# Patient Record
Sex: Male | Born: 1965
Health system: Southern US, Community
[De-identification: ages and names within clinical notes are randomized; demographics above are authoritative.]

---

## 2011-02-22 DIAGNOSIS — I420 Dilated cardiomyopathy: Secondary | ICD-10-CM | POA: Insufficient documentation

## 2011-02-22 DIAGNOSIS — I2510 Atherosclerotic heart disease of native coronary artery without angina pectoris: Secondary | ICD-10-CM | POA: Insufficient documentation

## 2011-02-22 DIAGNOSIS — E782 Mixed hyperlipidemia: Secondary | ICD-10-CM | POA: Insufficient documentation

## 2011-10-19 DIAGNOSIS — I890 Lymphedema, not elsewhere classified: Secondary | ICD-10-CM | POA: Insufficient documentation

## 2011-11-17 DIAGNOSIS — D6851 Activated protein C resistance: Secondary | ICD-10-CM | POA: Insufficient documentation

## 2011-11-21 DIAGNOSIS — G4733 Obstructive sleep apnea (adult) (pediatric): Secondary | ICD-10-CM | POA: Insufficient documentation

## 2011-11-21 DIAGNOSIS — I10 Essential (primary) hypertension: Secondary | ICD-10-CM | POA: Insufficient documentation

## 2011-11-21 DIAGNOSIS — I2699 Other pulmonary embolism without acute cor pulmonale: Secondary | ICD-10-CM | POA: Insufficient documentation

## 2012-09-16 DIAGNOSIS — I2510 Atherosclerotic heart disease of native coronary artery without angina pectoris: Secondary | ICD-10-CM | POA: Insufficient documentation

## 2012-09-21 DIAGNOSIS — M48061 Spinal stenosis, lumbar region without neurogenic claudication: Secondary | ICD-10-CM | POA: Insufficient documentation

## 2013-07-10 DIAGNOSIS — I82519 Chronic embolism and thrombosis of unspecified femoral vein: Secondary | ICD-10-CM | POA: Insufficient documentation

## 2013-07-10 DIAGNOSIS — I872 Venous insufficiency (chronic) (peripheral): Secondary | ICD-10-CM | POA: Insufficient documentation

## 2013-07-22 DIAGNOSIS — M79604 Pain in right leg: Secondary | ICD-10-CM | POA: Insufficient documentation

## 2013-09-01 DIAGNOSIS — F411 Generalized anxiety disorder: Secondary | ICD-10-CM | POA: Insufficient documentation

## 2013-09-03 DIAGNOSIS — F329 Major depressive disorder, single episode, unspecified: Secondary | ICD-10-CM | POA: Insufficient documentation

## 2013-09-12 DIAGNOSIS — B351 Tinea unguium: Secondary | ICD-10-CM | POA: Insufficient documentation

## 2013-09-12 DIAGNOSIS — L570 Actinic keratosis: Secondary | ICD-10-CM | POA: Insufficient documentation

## 2014-12-23 DIAGNOSIS — I872 Venous insufficiency (chronic) (peripheral): Secondary | ICD-10-CM | POA: Insufficient documentation

## 2014-12-23 DIAGNOSIS — I739 Peripheral vascular disease, unspecified: Secondary | ICD-10-CM | POA: Insufficient documentation

## 2015-10-19 DIAGNOSIS — Z9889 Other specified postprocedural states: Secondary | ICD-10-CM | POA: Insufficient documentation

## 2015-10-23 DIAGNOSIS — E8770 Fluid overload, unspecified: Secondary | ICD-10-CM | POA: Insufficient documentation

## 2015-10-23 DIAGNOSIS — I5023 Acute on chronic systolic (congestive) heart failure: Secondary | ICD-10-CM | POA: Insufficient documentation

## 2015-11-03 DIAGNOSIS — I890 Lymphedema, not elsewhere classified: Secondary | ICD-10-CM | POA: Insufficient documentation

## 2015-11-03 DIAGNOSIS — T8189XA Other complications of procedures, not elsewhere classified, initial encounter: Secondary | ICD-10-CM | POA: Insufficient documentation

## 2015-11-09 DIAGNOSIS — T8131XA Disruption of external operation (surgical) wound, not elsewhere classified, initial encounter: Secondary | ICD-10-CM | POA: Insufficient documentation

## 2015-11-19 DIAGNOSIS — F332 Major depressive disorder, recurrent severe without psychotic features: Secondary | ICD-10-CM | POA: Insufficient documentation

## 2015-12-07 DIAGNOSIS — T8131XA Disruption of external operation (surgical) wound, not elsewhere classified, initial encounter: Secondary | ICD-10-CM | POA: Diagnosis not present

## 2015-12-07 DIAGNOSIS — I2510 Atherosclerotic heart disease of native coronary artery without angina pectoris: Secondary | ICD-10-CM | POA: Diagnosis present

## 2015-12-07 DIAGNOSIS — E039 Hypothyroidism, unspecified: Secondary | ICD-10-CM | POA: Diagnosis present

## 2015-12-07 DIAGNOSIS — E13620 Other specified diabetes mellitus with diabetic dermatitis: Secondary | ICD-10-CM | POA: Diagnosis present

## 2015-12-07 DIAGNOSIS — F329 Major depressive disorder, single episode, unspecified: Secondary | ICD-10-CM | POA: Diagnosis present

## 2015-12-07 DIAGNOSIS — A419 Sepsis, unspecified organism: Secondary | ICD-10-CM | POA: Diagnosis not present

## 2015-12-07 DIAGNOSIS — E09649 Drug or chemical induced diabetes mellitus with hypoglycemia without coma: Secondary | ICD-10-CM | POA: Diagnosis present

## 2015-12-07 DIAGNOSIS — Z6841 Body Mass Index (BMI) 40.0 and over, adult: Secondary | ICD-10-CM | POA: Diagnosis not present

## 2015-12-07 DIAGNOSIS — I110 Hypertensive heart disease with heart failure: Secondary | ICD-10-CM | POA: Diagnosis present

## 2015-12-07 DIAGNOSIS — R9431 Abnormal electrocardiogram [ECG] [EKG]: Secondary | ICD-10-CM | POA: Insufficient documentation

## 2015-12-07 DIAGNOSIS — I272 Other secondary pulmonary hypertension: Secondary | ICD-10-CM | POA: Diagnosis present

## 2015-12-07 DIAGNOSIS — G4733 Obstructive sleep apnea (adult) (pediatric): Secondary | ICD-10-CM | POA: Diagnosis present

## 2015-12-07 DIAGNOSIS — E8770 Fluid overload, unspecified: Secondary | ICD-10-CM | POA: Diagnosis present

## 2015-12-07 DIAGNOSIS — F419 Anxiety disorder, unspecified: Secondary | ICD-10-CM | POA: Diagnosis present

## 2015-12-07 DIAGNOSIS — I5023 Acute on chronic systolic (congestive) heart failure: Secondary | ICD-10-CM | POA: Diagnosis not present

## 2015-12-07 DIAGNOSIS — K219 Gastro-esophageal reflux disease without esophagitis: Secondary | ICD-10-CM | POA: Diagnosis present

## 2015-12-07 DIAGNOSIS — R600 Localized edema: Secondary | ICD-10-CM | POA: Insufficient documentation

## 2015-12-07 DIAGNOSIS — D649 Anemia, unspecified: Secondary | ICD-10-CM | POA: Diagnosis present

## 2015-12-07 DIAGNOSIS — I872 Venous insufficiency (chronic) (peripheral): Secondary | ICD-10-CM | POA: Diagnosis present

## 2015-12-07 DIAGNOSIS — I471 Supraventricular tachycardia: Secondary | ICD-10-CM | POA: Diagnosis present

## 2015-12-07 DIAGNOSIS — G894 Chronic pain syndrome: Secondary | ICD-10-CM | POA: Diagnosis present

## 2015-12-07 DIAGNOSIS — R6520 Severe sepsis without septic shock: Secondary | ICD-10-CM | POA: Diagnosis present

## 2015-12-07 DIAGNOSIS — B358 Other dermatophytoses: Secondary | ICD-10-CM | POA: Diagnosis present

## 2015-12-07 DIAGNOSIS — E876 Hypokalemia: Secondary | ICD-10-CM | POA: Diagnosis present

## 2015-12-07 DIAGNOSIS — E1151 Type 2 diabetes mellitus with diabetic peripheral angiopathy without gangrene: Secondary | ICD-10-CM | POA: Diagnosis present

## 2015-12-29 DIAGNOSIS — R197 Diarrhea, unspecified: Secondary | ICD-10-CM | POA: Diagnosis not present

## 2015-12-29 DIAGNOSIS — R159 Full incontinence of feces: Secondary | ICD-10-CM | POA: Diagnosis not present

## 2016-01-25 DIAGNOSIS — E08621 Diabetes mellitus due to underlying condition with foot ulcer: Secondary | ICD-10-CM | POA: Insufficient documentation

## 2016-01-25 DIAGNOSIS — L97529 Non-pressure chronic ulcer of other part of left foot with unspecified severity: Secondary | ICD-10-CM

## 2016-01-25 DIAGNOSIS — L97519 Non-pressure chronic ulcer of other part of right foot with unspecified severity: Secondary | ICD-10-CM

## 2016-04-18 DIAGNOSIS — L0390 Cellulitis, unspecified: Secondary | ICD-10-CM | POA: Insufficient documentation

## 2016-04-19 DIAGNOSIS — Z765 Malingerer [conscious simulation]: Secondary | ICD-10-CM | POA: Insufficient documentation

## 2016-06-23 DIAGNOSIS — M5137 Other intervertebral disc degeneration, lumbosacral region: Secondary | ICD-10-CM | POA: Diagnosis not present

## 2016-06-23 DIAGNOSIS — Z9049 Acquired absence of other specified parts of digestive tract: Secondary | ICD-10-CM | POA: Diagnosis not present

## 2016-06-23 DIAGNOSIS — M5440 Lumbago with sciatica, unspecified side: Secondary | ICD-10-CM | POA: Diagnosis not present

## 2016-06-23 DIAGNOSIS — Z794 Long term (current) use of insulin: Secondary | ICD-10-CM | POA: Diagnosis not present

## 2016-06-23 DIAGNOSIS — I2510 Atherosclerotic heart disease of native coronary artery without angina pectoris: Secondary | ICD-10-CM | POA: Diagnosis not present

## 2016-06-23 DIAGNOSIS — M5135 Other intervertebral disc degeneration, thoracolumbar region: Secondary | ICD-10-CM | POA: Diagnosis not present

## 2016-06-23 DIAGNOSIS — R159 Full incontinence of feces: Secondary | ICD-10-CM | POA: Diagnosis not present

## 2016-06-23 DIAGNOSIS — M545 Low back pain: Secondary | ICD-10-CM | POA: Diagnosis not present

## 2016-06-23 DIAGNOSIS — R262 Difficulty in walking, not elsewhere classified: Secondary | ICD-10-CM | POA: Diagnosis not present

## 2016-06-23 DIAGNOSIS — M8580 Other specified disorders of bone density and structure, unspecified site: Secondary | ICD-10-CM | POA: Diagnosis not present

## 2016-06-23 DIAGNOSIS — R202 Paresthesia of skin: Secondary | ICD-10-CM | POA: Diagnosis not present

## 2016-06-23 DIAGNOSIS — I10 Essential (primary) hypertension: Secondary | ICD-10-CM | POA: Diagnosis not present

## 2016-06-23 DIAGNOSIS — E1165 Type 2 diabetes mellitus with hyperglycemia: Secondary | ICD-10-CM | POA: Diagnosis not present

## 2016-06-23 DIAGNOSIS — M4854XA Collapsed vertebra, not elsewhere classified, thoracic region, initial encounter for fracture: Secondary | ICD-10-CM | POA: Diagnosis not present

## 2016-06-23 DIAGNOSIS — G4733 Obstructive sleep apnea (adult) (pediatric): Secondary | ICD-10-CM | POA: Diagnosis not present

## 2016-06-24 DIAGNOSIS — R195 Other fecal abnormalities: Secondary | ICD-10-CM | POA: Diagnosis not present

## 2016-06-24 DIAGNOSIS — I2510 Atherosclerotic heart disease of native coronary artery without angina pectoris: Secondary | ICD-10-CM | POA: Diagnosis not present

## 2016-06-24 DIAGNOSIS — R159 Full incontinence of feces: Secondary | ICD-10-CM | POA: Diagnosis not present

## 2016-07-19 DIAGNOSIS — Z8614 Personal history of Methicillin resistant Staphylococcus aureus infection: Secondary | ICD-10-CM | POA: Diagnosis not present

## 2016-07-19 DIAGNOSIS — E785 Hyperlipidemia, unspecified: Secondary | ICD-10-CM | POA: Diagnosis not present

## 2016-07-19 DIAGNOSIS — Z7901 Long term (current) use of anticoagulants: Secondary | ICD-10-CM | POA: Diagnosis not present

## 2016-07-19 DIAGNOSIS — M1611 Unilateral primary osteoarthritis, right hip: Secondary | ICD-10-CM | POA: Diagnosis not present

## 2016-07-19 DIAGNOSIS — E039 Hypothyroidism, unspecified: Secondary | ICD-10-CM | POA: Diagnosis not present

## 2016-07-19 DIAGNOSIS — R809 Proteinuria, unspecified: Secondary | ICD-10-CM | POA: Diagnosis not present

## 2016-07-19 DIAGNOSIS — Z86718 Personal history of other venous thrombosis and embolism: Secondary | ICD-10-CM | POA: Diagnosis not present

## 2016-07-19 DIAGNOSIS — I110 Hypertensive heart disease with heart failure: Secondary | ICD-10-CM | POA: Diagnosis not present

## 2016-07-19 DIAGNOSIS — Z794 Long term (current) use of insulin: Secondary | ICD-10-CM | POA: Diagnosis not present

## 2016-07-19 DIAGNOSIS — E1165 Type 2 diabetes mellitus with hyperglycemia: Secondary | ICD-10-CM | POA: Diagnosis not present

## 2016-07-19 DIAGNOSIS — Z9981 Dependence on supplemental oxygen: Secondary | ICD-10-CM | POA: Diagnosis not present

## 2016-07-19 DIAGNOSIS — N2889 Other specified disorders of kidney and ureter: Secondary | ICD-10-CM | POA: Diagnosis not present

## 2016-07-19 DIAGNOSIS — Z6841 Body Mass Index (BMI) 40.0 and over, adult: Secondary | ICD-10-CM | POA: Diagnosis not present

## 2016-07-19 DIAGNOSIS — Z91041 Radiographic dye allergy status: Secondary | ICD-10-CM | POA: Diagnosis not present

## 2016-07-19 DIAGNOSIS — G8929 Other chronic pain: Secondary | ICD-10-CM | POA: Diagnosis not present

## 2016-07-19 DIAGNOSIS — I150 Renovascular hypertension: Secondary | ICD-10-CM | POA: Diagnosis not present

## 2016-07-19 DIAGNOSIS — Z86711 Personal history of pulmonary embolism: Secondary | ICD-10-CM | POA: Diagnosis not present

## 2016-07-19 DIAGNOSIS — Z79899 Other long term (current) drug therapy: Secondary | ICD-10-CM | POA: Diagnosis not present

## 2016-07-19 DIAGNOSIS — E1159 Type 2 diabetes mellitus with other circulatory complications: Secondary | ICD-10-CM | POA: Diagnosis not present

## 2016-07-19 DIAGNOSIS — M545 Low back pain: Secondary | ICD-10-CM | POA: Diagnosis not present

## 2016-07-19 DIAGNOSIS — Z7982 Long term (current) use of aspirin: Secondary | ICD-10-CM | POA: Diagnosis not present

## 2017-02-08 ENCOUNTER — Encounter: Payer: Self-pay | Admitting: Osteopathic Medicine

## 2017-02-08 ENCOUNTER — Ambulatory Visit (INDEPENDENT_AMBULATORY_CARE_PROVIDER_SITE_OTHER): Payer: Medicare Other | Admitting: Osteopathic Medicine

## 2017-02-08 ENCOUNTER — Other Ambulatory Visit: Payer: Self-pay | Admitting: Osteopathic Medicine

## 2017-02-08 VITALS — BP 109/94 | HR 100 | Ht 69.0 in | Wt 395.0 lb

## 2017-02-08 DIAGNOSIS — F339 Major depressive disorder, recurrent, unspecified: Secondary | ICD-10-CM | POA: Diagnosis not present

## 2017-02-08 DIAGNOSIS — E1165 Type 2 diabetes mellitus with hyperglycemia: Secondary | ICD-10-CM | POA: Diagnosis not present

## 2017-02-08 DIAGNOSIS — Z794 Long term (current) use of insulin: Secondary | ICD-10-CM

## 2017-02-08 DIAGNOSIS — Z86711 Personal history of pulmonary embolism: Secondary | ICD-10-CM | POA: Diagnosis not present

## 2017-02-08 DIAGNOSIS — E039 Hypothyroidism, unspecified: Secondary | ICD-10-CM | POA: Diagnosis not present

## 2017-02-08 DIAGNOSIS — IMO0002 Reserved for concepts with insufficient information to code with codable children: Secondary | ICD-10-CM

## 2017-02-08 DIAGNOSIS — G8929 Other chronic pain: Secondary | ICD-10-CM | POA: Diagnosis not present

## 2017-02-08 DIAGNOSIS — E118 Type 2 diabetes mellitus with unspecified complications: Secondary | ICD-10-CM | POA: Diagnosis not present

## 2017-02-08 LAB — POCT GLYCOSYLATED HEMOGLOBIN (HGB A1C): Hemoglobin A1C: 12.5

## 2017-02-08 MED ORDER — GLUCOSE BLOOD VI STRP: ORAL_STRIP | 99 refills | Status: AC

## 2017-02-08 MED ORDER — INSULIN DEGLUDEC 100 UNIT/ML ~~LOC~~ SOPN: 15.0000 [IU] | PEN_INJECTOR | Freq: Every day | SUBCUTANEOUS | Status: DC

## 2017-02-08 MED ORDER — BLOOD GLUCOSE MONITOR KIT: PACK | 99 refills | Status: AC

## 2017-02-08 MED ORDER — LEVOTHYROXINE SODIUM 100 MCG PO TABS: 100.0000 ug | ORAL_TABLET | Freq: Every day | ORAL | 3 refills | Status: DC

## 2017-02-08 MED ORDER — LANCET DEVICE MISC: 99 refills | Status: AC

## 2017-02-08 MED ORDER — OXYCODONE-ACETAMINOPHEN 7.5-325 MG PO TABS: 1.0000 | ORAL_TABLET | ORAL | 0 refills | Status: AC | PRN

## 2017-02-08 NOTE — Progress Notes (Signed)
HPI: Mark Dean is a 51 y.o. male  who presents to [facility] today, 02/08/17,  for chief complaint of:  Chief Complaint  Patient presents with  . Establish Care    LEG PAIN, ANXIETY, DIABETIC    New patient here today to establish care. Significant pain issues, patient states he was previously following at pain management for severe back/leg pain. No records currently available. He thinks previously was seen at [facility], [facility] (?). Was frustrated with the specialists because he didn't seem to be helping very much, unable to keep consistent follow-up due to financial problems. Patient states he was previously in the [facility] but was discharged due to back problems/medical issues. He has undergone physical therapy by unable to tolerated, try to push through the pain but was not able to do so, but that was only making him worse. Tried home exercises/going to the gym but was unable to tolerate exercise due to pain. He is worried about what is going to happen to him if he is not able to get up and move around/take care of himself. Most of the time he was around in bed, he is getting very depressed about his condition and his pain Previous surgical interventions:  History of what sounds like probably epidural injections.   History of back surgery in 2013. Patient states suffered some complications from this Previous medications included:  Gabapentin - no side effect but wasn't helping the pain   Lyrica - caused serious depression side effects   Roxicodone, Percocet - thought he was taking too much so was on Suboxone but he weaned himself off.   Other medical problems are currently poorly controlled, patient lacks access to prescription medication coverage.  History of factor V Leiden, history of pulmonary emboli. Has been off of anticoagulation for some time now.  Diabetes - ran into problems with disability last year and unable to  get meds. Was on Metformin and then on insulin. Currently off Insulin for about 3 months. Previously on Lantus   Thyroid - off of levothyroxine for some time now  CPAP - uses nightly for OSA    Patient is accompanied by mom who is here from out of town and assists with history-taking.   Past medical, surgical, social and family history reviewed: There are no active problems to display for this patient.  History reviewed. No pertinent surgical history. Social History  Substance Use Topics  . Smoking status: Unknown If Ever Smoked  . Smokeless tobacco: Never Used  . Alcohol use Not on file   History reviewed. No pertinent family history.   Current medication list and allergy/intolerance information reviewed:   Current Outpatient Prescriptions  Medication Sig Dispense Refill  . acetaminophen (TYLENOL) 500 MG tablet Take 500 mg by mouth every 6 (six) hours as needed.    Marland Kitchen aspirin EC 81 MG tablet Take 81 mg by mouth daily.     No current facility-administered medications for this visit.    No Known Allergies    Review of Systems:  Constitutional:  No  fever, no chills, No recent illness, No unintentional weight changes. No significant fatigue.   HEENT: No  headache, no vision change, no hearing change, No sore throat, No  sinus pressure  Cardiac: No  chest pain, No  pressure, No palpitations, No  Orthopnea  Respiratory:  No  shortness of breath. No  Cough  Gastrointestinal: No  abdominal pain, No  nausea, No  vomiting,  No  blood in stool, No  diarrhea, No  constipation   Musculoskeletal: No new myalgia/arthralgia but severe chronic pain lower back and legs  Genitourinary: No  incontinence, No  abnormal genital bleeding, No abnormal genital discharge  Skin: No  Rash, No other wounds/concerning lesions  Hem/Onc: No  easy bruising/bleeding, No  abnormal lymph node  Endocrine: No cold intolerance,  No heat intolerance. No polyuria/polydipsia/polyphagia   Neurologic: No   weakness, No  dizziness, No  slurred speech/focal weakness/facial droop  Psychiatric: + concerns with depression, +concerns with anxiety, +sleep problems, No mood problems  Exam:  BP (!) 109/94   Pulse 100   Ht 5\' 9"  (1.753 m)   Wt (!) 395 lb (179.2 kg)   BMI 58.33 kg/m   Constitutional: VS see above. General Appearance: alert, well-developed, well-nourished, NAD  Eyes: Normal lids and conjunctive, non-icteric sclera  Ears, Nose, Mouth, Throat: MMM, Normal external inspection ears/nares/mouth/lips/gums.   Neck: No masses, trachea midline.  Respiratory: Normal respiratory effort. no wheeze, no rhonchi, no rales  Cardiovascular: S1/S2 normal, no murmur, no rub/gallop auscultated. RRR. +lower extremity edema and skin changes consistent with vascular disease.  Gastrointestinal: Obesity precludes exam  Musculoskeletal: Gait unable to be assessed, patient in wheelchair, has great difficulty transferring from chair to wheelchair.  Neurological: Motor and sensation intact and symmetric. Cerebellar reflexes intact.   Psychiatric: Normal judgment/insight. Depressed mood and affect. Oriented x3.    Results for orders placed or performed in visit on 02/08/17 (from the past 72 hour(s))  POCT HgB A1C     Status: None   Collection Time: 02/08/17  2:22 PM  Result Value Ref Range   Hemoglobin A1C 12.5      Recent Results (from the past 2160 hour(s))  POCT HgB A1C     Status: None   Collection Time: 02/08/17  2:22 PM  Result Value Ref Range   Hemoglobin A1C 12.5   Pain Mgmt, Profile 6 Conf w/o mM, U     Status: None   Collection Time: 02/08/17  3:33 PM  Result Value Ref Range   Creatinine 148.5 > or = 20.0 mg/dL   pH 6.24 4.5 - 9.0   Oxidant NEGATIVE <200 mcg/mL   Amphetamines NEGATIVE <500 ng/mL   Barbiturates NEGATIVE <300 ng/mL   Benzodiazepines NEGATIVE <100 ng/mL   Marijuana Metabolite NEGATIVE <20 ng/mL   Cocaine Metabolite NEGATIVE <150 ng/mL   Methadone Metabolite  NEGATIVE <100 ng/mL   Opiates NEGATIVE <100 ng/mL   Oxycodone NEGATIVE <100 ng/mL   Phencyclidine NEGATIVE <25 ng/mL   Alcohol Metabolites NEGATIVE <500 ng/mL   Please note: .    6 Acetylmorphine NEGATIVE <10 ng/mL    Comment:   This drug testing is for medical treatment only. Analysis was performed as non-forensic testing and these results should be used only by healthcare providers to render diagnosis or treatment, or to monitor progress of medical conditions.   For assistance with interpreting these drug results, please contact a [employer] Toxicology Specialist: [phone] [place] [phone]), M-F, 8am-6pm EST.        ASSESSMENT/PLAN:   First step is going to have to be get medical problems under control, certainly poorly controlled diabetes and hypothyroidism can put patient in further danger of decompensation  Pain management is going to be tricky. Would like to hold of some previous records. I am okay to prescribe opiate pain medications sounds like he was not able to tolerate Lyrica, would consider putting him back on Cymbalta though  he states this wasn't super helpful for pain may need to consider as augmentation for pain/depression. Ideally, would like to get him on long-term NSAIDs as well, would like to check kidney function first before putting him back on these   Extensive discussion with the patient about setting goals for pain management. We'll need to review some records to fully evaluate what exactly his musculoskeletal conditions are. We may not be able to bring his pain down to 0 but our goal for now is to get him up and functioning so that he can at least take care of activities of daily living on his own without assistance.  We'll discuss anticoagulation at next visit after labs have been obtained.   Uncontrolled type 2 diabetes mellitus with complication, with long-term current use of insulin ([place]) - Plan: POCT HgB A1C, CBC with Differential/Platelet,  COMPLETE METABOLIC PANEL WITH GFR  Hypothyroidism, unspecified type - Plan: TSH  Other chronic pain - Plan: oxyCODONE-acetaminophen (PERCOCET) 7.5-325 MG tablet, Drugs of abuse screen w/o alc (for BH OP)  Depression, recurrent ([place])  History of pulmonary embolism    Patient Instructions  For Diabetes  We will work on getting prescription coverage for you! In the meantime, use the sample you were given in the office.   Measure fasting blood sugar every day: goal for now is to get this level to 80-130  Increase daily Insulin dose by 3 units at a time, twice per week, until fasting sugars are consistently 80-130, then continue at that dose  Plan to recheck A1C in 3 months  Plan to follow-up in the office sooner if you experience low sugars   Plan to follow-up in the office sooner if your fasting sugars are consistently high despite going up on insulin  Bring all sugar readings with you to your office visits  For Pain  [place] law requires initial opiate prescriptions be for 5-days supply only and additional refills will require a consultation in the office. Please make an appointment to see me next week for refills and to see how you're doing on the medications.   [place] law and clinic policy also follow the information outlined in your controlled substance agreement, including policy on drug screens. Please keep this copy for your records   Alteration in pain regimen will depend on multiple factors, please keep all follow-up visits to address this  If urine drug screen gives any concerns, we will discuss this at your next visit      Visit summary with medication list and pertinent instructions was printed for patient to review. All questions at time of visit were answered - patient instructed to contact office with any additional concerns. ER/RTC precautions were reviewed with the patient. Follow-up plan: Return in about 1 week (around 02/15/2017) for [facility].  Note: Total time  spent 60 minutes, greater than 50% of the visit was spent face-to-face counseling and coordinating care for the following: The primary encounter diagnosis was Uncontrolled type 2 diabetes mellitus with complication, with long-term current use of insulin ([place]). Diagnoses of Hypothyroidism, unspecified type, Other chronic pain, Depression, recurrent ([place]), and History of pulmonary embolism were also pertinent to this visit..       Date Dispensed/ Date Prescribed Drug Name/ NDC Qty. Dispensed/ Days Supply Refill #/ Authorized Refills Programmer, multimedia Recipient *Pmt. Method **MED Daily 07/22/2016 07/20/2016 OXYCODONE- ACETAMINOPHEN 5- 325 [id number] 15 2 0 0 [id number] PARKER LAUREN M [place], [place] [id number] [facility] [address] [place], Henri June 21, 1966  [place], [place] [address] 04 56.25 10/08/2015 06/18/2015 CLONAZEPAM 2 MG TABLET [id number] 30 2 2  [id number] [place], [place] [id number] [facility] [id number] Cherrie Gauze [place], [place] 1966/10/09 [place], [place] [address] 04 0

## 2017-02-08 NOTE — Patient Instructions (Signed)
For Diabetes  We will work on getting prescription coverage for you! In the meantime, use the sample you were given in the office.   Measure fasting blood sugar every day: goal for now is to get this level to 80-130  Increase daily Insulin dose by 3 units at a time, twice per week, until fasting sugars are consistently 80-130, then continue at that dose  Plan to recheck A1C in 3 months  Plan to follow-up in the office sooner if you experience low sugars   Plan to follow-up in the office sooner if your fasting sugars are consistently high despite going up on insulin  Bring all sugar readings with you to your office visits  For Pain  [place] law requires initial opiate prescriptions be for 5-days supply only and additional refills will require a consultation in the office. Please make an appointment to see me next week for refills and to see how you're doing on the medications.    law and clinic policy also follow the information outlined in your controlled substance agreement, including policy on drug screens. Please keep this copy for your records   Alteration in pain regimen will depend on multiple factors, please keep all follow-up visits to address this  If urine drug screen gives any concerns, we will discuss this at your next visit

## 2017-02-09 LAB — PAIN MGMT, PROFILE 6 CONF W/O MM, U
6 Acetylmorphine: NEGATIVE ng/mL (ref <10)
AMPHETAMINES: NEGATIVE ng/mL (ref <500)
Alcohol Metabolites: NEGATIVE ng/mL (ref <500)
BARBITURATES: NEGATIVE ng/mL (ref <300)
Benzodiazepines: NEGATIVE ng/mL (ref <100)
CREATININE: 148.5 mg/dL (ref >=20.0)
Cocaine Metabolite: NEGATIVE ng/mL (ref <150)
MARIJUANA METABOLITE: NEGATIVE ng/mL (ref <20)
Methadone Metabolite: NEGATIVE ng/mL (ref <100)
OPIATES: NEGATIVE ng/mL (ref <100)
OXIDANT: NEGATIVE ug/mL (ref <200)
OXYCODONE: NEGATIVE ng/mL (ref <100)
PLEASE NOTE: 0
Phencyclidine: NEGATIVE ng/mL (ref <25)
pH: 6.24 (ref 4.5–9.0)

## 2017-02-10 DIAGNOSIS — E039 Hypothyroidism, unspecified: Secondary | ICD-10-CM | POA: Insufficient documentation

## 2017-02-10 DIAGNOSIS — Z86711 Personal history of pulmonary embolism: Secondary | ICD-10-CM | POA: Insufficient documentation

## 2017-02-10 DIAGNOSIS — IMO0002 Reserved for concepts with insufficient information to code with codable children: Secondary | ICD-10-CM | POA: Insufficient documentation

## 2017-02-10 DIAGNOSIS — E1165 Type 2 diabetes mellitus with hyperglycemia: Secondary | ICD-10-CM | POA: Insufficient documentation

## 2017-02-10 DIAGNOSIS — E118 Type 2 diabetes mellitus with unspecified complications: Principal | ICD-10-CM

## 2017-02-10 DIAGNOSIS — Z794 Long term (current) use of insulin: Principal | ICD-10-CM

## 2017-02-10 DIAGNOSIS — G8929 Other chronic pain: Secondary | ICD-10-CM | POA: Insufficient documentation

## 2017-02-10 DIAGNOSIS — F339 Major depressive disorder, recurrent, unspecified: Secondary | ICD-10-CM | POA: Insufficient documentation

## 2017-02-14 ENCOUNTER — Telehealth: Payer: Self-pay | Admitting: Osteopathic Medicine

## 2017-02-14 NOTE — Telephone Encounter (Signed)
-----   Message from Emeterio Reeve, DO sent at 02/10/2017  1:17 PM EST ----- Regarding: records/Epic Patient [facility] but I don't see anything for him in care everywhere, can we see if we can connect the records on Epic? He brought to discharge medication list from [facility], his MR number there is [id number]. I don't have anything from [facility]. Thanks!

## 2017-02-14 NOTE — Telephone Encounter (Signed)
Care Everywhere updated

## 2017-02-15 ENCOUNTER — Encounter: Payer: Self-pay | Admitting: Osteopathic Medicine

## 2017-02-15 ENCOUNTER — Telehealth: Payer: Self-pay | Admitting: Osteopathic Medicine

## 2017-02-15 ENCOUNTER — Ambulatory Visit (INDEPENDENT_AMBULATORY_CARE_PROVIDER_SITE_OTHER): Payer: Medicare Other | Admitting: Osteopathic Medicine

## 2017-02-15 VITALS — BP 113/87 | HR 60

## 2017-02-15 DIAGNOSIS — I872 Venous insufficiency (chronic) (peripheral): Secondary | ICD-10-CM | POA: Diagnosis not present

## 2017-02-15 DIAGNOSIS — F332 Major depressive disorder, recurrent severe without psychotic features: Secondary | ICD-10-CM | POA: Diagnosis not present

## 2017-02-15 DIAGNOSIS — F411 Generalized anxiety disorder: Secondary | ICD-10-CM

## 2017-02-15 DIAGNOSIS — R21 Rash and other nonspecific skin eruption: Secondary | ICD-10-CM | POA: Diagnosis not present

## 2017-02-15 DIAGNOSIS — E039 Hypothyroidism, unspecified: Secondary | ICD-10-CM

## 2017-02-15 DIAGNOSIS — G894 Chronic pain syndrome: Secondary | ICD-10-CM | POA: Diagnosis not present

## 2017-02-15 MED ORDER — DULOXETINE HCL 30 MG PO CPEP: 30.0000 mg | ORAL_CAPSULE | Freq: Every day | ORAL | 1 refills | Status: DC

## 2017-02-15 MED ORDER — CLONAZEPAM 0.5 MG PO TABS: 0.5000 mg | ORAL_TABLET | Freq: Two times a day (BID) | ORAL | 0 refills | Status: DC | PRN

## 2017-02-15 MED ORDER — ACETAMINOPHEN 500 MG PO TABS: 1000.0000 mg | ORAL_TABLET | Freq: Four times a day (QID) | ORAL | 0 refills | Status: DC | PRN

## 2017-02-15 MED ORDER — NAPROXEN 500 MG PO TABS: 500.0000 mg | ORAL_TABLET | Freq: Two times a day (BID) | ORAL | 0 refills | Status: DC

## 2017-02-15 MED ORDER — BACLOFEN 10 MG PO TABS: 10.0000 mg | ORAL_TABLET | Freq: Four times a day (QID) | ORAL | 0 refills | Status: DC

## 2017-02-15 NOTE — Patient Instructions (Addendum)
Plan:  If the opiate pain medications were not helpful, we should stop these and transition to a combination of the following:  strong antiinflammatory + higher-dose tylenol + muscle relaxer  antidepressant + antianxiety medications  I am going to place a referral to psychiatry downstairs to help [place] address the depression and anxiety issues as well as helping cope with chronic pain. You have an appointment at 1:30.   If you feel suicidal or hopeless, please call 911 or [place] at [phone].   We will arrange home health to help, and we will pursue assisted living  We will arrange referral to wound care  We will have to repeat blood draw today  If you are worse, please go to an emergency room right away!

## 2017-02-15 NOTE — Telephone Encounter (Signed)
Addressed at his visit - no concern at this time that patient is a danger to himself, but he will require close followup

## 2017-02-15 NOTE — Progress Notes (Signed)
HPI: Mark Dean is a 51 y.o. male  who presents to [facility] today, 02/15/17,  for chief complaint of:  Chief Complaint  Patient presents with  . Follow-up   Pain: meds were not helpful. Pt is in so much pain he is very anxious and "at the end of my rope, I just want it to stop." I asked the patient directly if he is suicidal, she states he just "wants out" but no suicidal plan. He has been in psychiatric unit before, he states problems due to Lyrica was the reason for that. No history of suicidal attempts. Suffers from chronic low back and leg pain, see below for record review from previous pain management. Patient has tried and failed multiple medication and procedureal modalities.  Wound: blistering on the LE bilaterally in areas of skin on LE w/ signs of chronic venous stasis. Patient had some concern that this may be due to the insulin. His blisters have happened before. He was previously seen by [facility] wound care to 617, records reviewed.     Records reviewed: Pain mgt/ortho 2015 " [facility] s/p L2-pelvis PSF & L4-5 posterior lumbar interbody fusion on 09/21/2012 Pain started in 2008 no inciting event. Improved some after surgery. Located in the low back radiating down the bilateral lower extremities. Burning aching contant dull and frequent. Better with lying down. Interferes with sleep, leisure, chores, work, and Technical sales engineer. Tried and failed physical therapy, epidural, SCS, surgery, exercise, Tramadol, oxycodone, oxycontin, neurontin, lyrica, cymbalta,  He has been seen by [facility] and evaluated for SCS implant. Has been on suboxone in the past as well as been treated at [facility], and Dr. Pascal Lux in [place] previously. On 08/21/14, x-ray thoracic and lumbar spine, post-lumbar and iliac rod, screw fixation L3-S1 without hardware complication. Mild retrolisthesis of L1 on 2. On 09/08/14, MR cervical spine. No significant central canal  stenosis, mild degenerative disease with minimal disk bulge at L4-L5, L5-S1, at C3-C4, C4-C5. No significant foraminal stenosis. On 09/08/14, MR thoracic spine. No significant central canal stenosis, mild degenerative changes without significant disk bulge, no foraminal stenosis. On 09/08/14, MR lumbar spine poor image quality, mild scoliosis noted. No significant central canal stenosis, severe degenerative disk disease, status post fusion L3 through S1, some artifact from pedicles screws obscuring view. No significant foraminal stenosis noted. ASSESSMENT: 51 y.o. male with  1. Chronic pain syndrome Ambulatory referral to Neuropsychology  Ambulatory referral to Nutrition Services  2. Back pain, chronic  3. Weakness of both lower extremities  4. Low back pain without sciatica, unspecified back pain laterality  UDS or NCCSR inconsistencies to discuss/monitor: none PLAN:  1) d/c Topamax 2) Reviewed records from [facility] - no records from [facility] discussing suboxone therapy or reason for discontinuation of opioids. Last records we have are from 07/31/12- prior to discontinuation of opioid therapy.  3) Tried and failed multiple therapies- high risk and not a candidate for opioid therapy 4) Discussed weight management and cognitive behavioral therapy and referrals placed to Dr. Lyman Speller and nutrition 5) Discussed that I do not fill out long term disability paperwork 6) F/u prn"   Patient is accompanied by mom who assists with history-taking.   Past medical history, surgical history, social history and family history reviewed.  Patient Active Problem List   Diagnosis Date Noted  . Depression, recurrent ([place]) 02/10/2017  . Other chronic pain 02/10/2017  . History of pulmonary embolism 02/10/2017  . Hypothyroidism 02/10/2017  . Uncontrolled  type 2 diabetes mellitus with complication, with long-term current use of insulin ([place]) 02/10/2017    Current medication list and allergy/intolerance information  reviewed.   Current Outpatient Prescriptions on File Prior to Visit  Medication Sig Dispense Refill  . acetaminophen (TYLENOL) 500 MG tablet Take 500 mg by mouth every 6 (six) hours as needed.    Marland Kitchen aspirin EC 81 MG tablet Take 81 mg by mouth daily.    . blood glucose meter kit and supplies KIT Dispense based on patient and insurance preference. Use up to four times daily as directed. Please include lancets, test strips, control solution. 1 each 99  . glucose blood test strip Use up to 4 times per day as directed with glucometer. Disp: 100. Refill x99 100 each 99  . insulin degludec (TRESIBA) 100 UNIT/ML SOPN FlexTouch Pen Inject 0.15 mLs (15 Units total) into the skin daily. Increase daily Insulin dose by 3 units at a time, twice per week, until fasting sugars are consistently 80-130, then continue at that dose    . Lancet Device MISC Use up to 4 times per day as directed with glucometer. Disp: 100. Refill x99 100 each 99  . levothyroxine (SYNTHROID, LEVOTHROID) 100 MCG tablet Take 1 tablet (100 mcg total) by mouth daily. 90 tablet 3   No current facility-administered medications on file prior to visit.    No Known Allergies    Review of Systems:  Constitutional: No recent illness  Cardiac: No  chest pain  Respiratory:  No  shortness of breath.   Musculoskeletal: No new myalgia/arthralgia  Skin: +blistering   Psychiatric: +concerns with depression, +concerns with anxiety  Exam:  BP 113/87   Pulse 60   Constitutional: VS see above. General Appearance: alert, well-developed, well-nourished, NAD  Neck: No masses, trachea midline.   Respiratory: Normal respiratory effort.  Musculoskeletal: Gait unable to assess - pt can transfer from wheelchair to chair. Symmetric and independent movement of all extremities  Neurological: Normal balance/coordination.   Skin: venous stasis changes, (+)1 LE edema bilaterally, skin blistering on anterior legs  Psychiatric: Normal  judgment/insight. Normal mood and affect. Oriented x3.    Recent Results (from the past 2160 hour(s))  POCT HgB A1C     Status: None   Collection Time: 02/08/17  2:22 PM  Result Value Ref Range   Hemoglobin A1C 12.5   Pain Mgmt, Profile 6 Conf w/o mM, U     Status: None   Collection Time: 02/08/17  3:33 PM  Result Value Ref Range   Creatinine 148.5 > or = 20.0 mg/dL   pH 6.24 4.5 - 9.0   Oxidant NEGATIVE <200 mcg/mL   Amphetamines NEGATIVE <500 ng/mL   Barbiturates NEGATIVE <300 ng/mL   Benzodiazepines NEGATIVE <100 ng/mL   Marijuana Metabolite NEGATIVE <20 ng/mL   Cocaine Metabolite NEGATIVE <150 ng/mL   Methadone Metabolite NEGATIVE <100 ng/mL   Opiates NEGATIVE <100 ng/mL   Oxycodone NEGATIVE <100 ng/mL   Phencyclidine NEGATIVE <25 ng/mL   Alcohol Metabolites NEGATIVE <500 ng/mL   Please note: .    6 Acetylmorphine NEGATIVE <10 ng/mL    Comment:   This drug testing is for medical treatment only. Analysis was performed as non-forensic testing and these results should be used only by healthcare providers to render diagnosis or treatment, or to monitor progress of medical conditions.   For assistance with interpreting these drug results, please contact a [employer] Toxicology Specialist: [phone] [place] [phone]), M-F, 8am-6pm EST.  ASSESSMENT/PLAN: Chronic pain syndrome, patient states that opiate pain medications were not helpful so we'll go ahead and discontinue this. We are starting several other medications today. Hopefully combination as noted below will give some relief by suspect we will likely have to seek care from pain management. Patient is high risk for opiate pain medications. We will do her best to also try and initiate treatment for anxiety. Caution with low-dose benzodiazepines, patient was advised of risk of tolerance/dependency and risk of accidental overdose with these, but anxiety is certainly a concerning factor to overall  suffering/pain syndrome. Close follow-up with the patient in the next 2 days, he is also seeing wound care tomorrow, appointment and transportation has been arranged. He is seeing behavioral health downstairs on Monday, he is advised that they may alter the plan as far as the medications are concerned that I believe that psychiatry consultation as well as counseling will be helpful.  Chronic pain syndrome - Plan: acetaminophen (TYLENOL) 500 MG tablet, baclofen (LIORESAL) 10 MG tablet, naproxen (NAPROSYN) 500 MG tablet, DULoxetine (CYMBALTA) 30 MG capsule, CBC with Differential/Platelet, COMPLETE METABOLIC PANEL WITH GFR, Ambulatory referral to Home Health  Venous insufficiency of both lower extremities - Plan: AMB referral to wound care center, B Nat Peptide  Acute blistering eruption of skin - Plan: AMB referral to wound care center, Ambulatory referral to [facility]  Severe episode of recurrent major depressive disorder, without psychotic features ([place]) - Plan: clonazePAM (KLONOPIN) 0.5 MG tablet, DULoxetine (CYMBALTA) 30 MG capsule  Generalized anxiety disorder - Plan: clonazePAM (KLONOPIN) 0.5 MG tablet, DULoxetine (CYMBALTA) 30 MG capsule  Hypothyroidism, unspecified type - Plan: TSH    Patient Instructions  Plan:  If the opiate pain medications were not helpful, we should stop these and transition to a combination of the following:  strong antiinflammatory + higher-dose tylenol + muscle relaxer  antidepressant + antianxiety medications  I am going to place a referral to psychiatry downstairs to help [place] address the depression and anxiety issues as well as helping cope with chronic pain. You have an appointment at 1:30.   If you feel suicidal or hopeless, please call 911 or [place] at [phone].   We will arrange home health to help, and we will pursue assisted living  We will arrange referral to wound care  We will have to repeat blood draw  today  If you are worse, please go to an emergency room right away!     Follow-up plan: Return for follow-up with Dr. Sheppard Coil 1-2 days to evaluate depression/anxiety .  Visit summary with medication list and pertinent instructions was printed for patient to review, alert [place] if any changes needed. All questions at time of visit were answered - patient instructed to contact office with any additional concerns. ER/RTC precautions were reviewed with the patient and understanding verbalized.   Note: Total time spent 45 minutes, greater than 50% of the visit was spent face-to-face counseling and coordinating care for the following: The primary encounter diagnosis was Chronic pain syndrome. Diagnoses of Venous insufficiency of both lower extremities, Acute blistering eruption of skin, Severe episode of recurrent major depressive disorder, without psychotic features ([place]), Generalized anxiety disorder, and Hypothyroidism, unspecified type were also pertinent to this visit.Marland Kitchen

## 2017-02-15 NOTE — Telephone Encounter (Signed)
Pt was contacted to offer help regarding Rx assistance and to schedule an AWV. During the phone call, the patient expressed he is feeling deeply depressed and is at "rock bottom". He says he is so much pain and that after his mother returns to [place] on 02/16/2017, he will be all alone. He told me that he no longer wants to live and "just wants to die". I encouraged patient to focus on attending his appointment with Dr. Sheppard Coil at 2pm today, and that I would work to find him a counselor in the community that can help him through this difficult time. mn

## 2017-02-16 ENCOUNTER — Telehealth: Payer: Self-pay | Admitting: Osteopathic Medicine

## 2017-02-16 DIAGNOSIS — L97821 Non-pressure chronic ulcer of other part of left lower leg limited to breakdown of skin: Secondary | ICD-10-CM | POA: Diagnosis not present

## 2017-02-16 DIAGNOSIS — R45851 Suicidal ideations: Secondary | ICD-10-CM | POA: Diagnosis not present

## 2017-02-16 DIAGNOSIS — E11622 Type 2 diabetes mellitus with other skin ulcer: Secondary | ICD-10-CM | POA: Diagnosis not present

## 2017-02-16 DIAGNOSIS — F329 Major depressive disorder, single episode, unspecified: Secondary | ICD-10-CM | POA: Diagnosis not present

## 2017-02-16 DIAGNOSIS — Z794 Long term (current) use of insulin: Secondary | ICD-10-CM | POA: Diagnosis not present

## 2017-02-16 DIAGNOSIS — L97211 Non-pressure chronic ulcer of right calf limited to breakdown of skin: Secondary | ICD-10-CM | POA: Diagnosis not present

## 2017-02-16 DIAGNOSIS — I509 Heart failure, unspecified: Secondary | ICD-10-CM | POA: Diagnosis not present

## 2017-02-16 DIAGNOSIS — E1140 Type 2 diabetes mellitus with diabetic neuropathy, unspecified: Secondary | ICD-10-CM | POA: Diagnosis not present

## 2017-02-16 DIAGNOSIS — I429 Cardiomyopathy, unspecified: Secondary | ICD-10-CM | POA: Diagnosis not present

## 2017-02-16 DIAGNOSIS — L03115 Cellulitis of right lower limb: Secondary | ICD-10-CM | POA: Diagnosis not present

## 2017-02-16 DIAGNOSIS — R52 Pain, unspecified: Secondary | ICD-10-CM | POA: Diagnosis not present

## 2017-02-16 DIAGNOSIS — I872 Venous insufficiency (chronic) (peripheral): Secondary | ICD-10-CM | POA: Diagnosis not present

## 2017-02-16 LAB — CBC WITH DIFFERENTIAL/PLATELET
Basophils Absolute: 77 cells/uL (ref 0–200)
Basophils Relative: 1 %
EOS PCT: 1 %
Eosinophils Absolute: 77 cells/uL (ref 15–500)
HCT: 47.2 % (ref 38.5–50.0)
HEMOGLOBIN: 15.2 g/dL (ref 13.2–17.1)
LYMPHS ABS: 1078 {cells}/uL (ref 850–3900)
Lymphocytes Relative: 14 %
MCH: 28.8 pg (ref 27.0–33.0)
MCHC: 32.2 g/dL (ref 32.0–36.0)
MCV: 89.6 fL (ref 80.0–100.0)
MPV: 11.5 fL (ref 7.5–12.5)
Monocytes Absolute: 616 cells/uL (ref 200–950)
Monocytes Relative: 8 %
NEUTROS PCT: 76 %
Neutro Abs: 5852 cells/uL (ref 1500–7800)
Platelets: 202 10*3/uL (ref 140–400)
RBC: 5.27 MIL/uL (ref 4.20–5.80)
RDW: 14 % (ref 11.0–15.0)
WBC: 7.7 10*3/uL (ref 3.8–10.8)

## 2017-02-16 LAB — BRAIN NATRIURETIC PEPTIDE: BRAIN NATRIURETIC PEPTIDE: 328.7 pg/mL — AB (ref <100)

## 2017-02-16 LAB — COMPLETE METABOLIC PANEL WITH GFR
ALBUMIN: 3.4 g/dL — AB (ref 3.6–5.1)
ALK PHOS: 102 U/L (ref 40–115)
ALT: 17 U/L (ref 9–46)
AST: 15 U/L (ref 10–35)
BILIRUBIN TOTAL: 2.4 mg/dL — AB (ref 0.2–1.2)
BUN: 31 mg/dL — ABNORMAL HIGH (ref 7–25)
CALCIUM: 8.9 mg/dL (ref 8.6–10.3)
CO2: 24 mmol/L (ref 20–31)
CREATININE: 0.93 mg/dL (ref 0.70–1.33)
Chloride: 99 mmol/L (ref 98–110)
GFR, Est Non African American: >89 mL/min (ref >=60)
Glucose, Bld: 235 mg/dL — ABNORMAL HIGH (ref 65–99)
Potassium: 5.1 mmol/L (ref 3.5–5.3)
Sodium: 137 mmol/L (ref 135–146)
TOTAL PROTEIN: 6 g/dL — AB (ref 6.1–8.1)

## 2017-02-16 LAB — TSH: TSH: 2.55 mIU/L (ref 0.40–4.50)

## 2017-02-16 NOTE — Telephone Encounter (Signed)
Received call from wound care at [facility], spoke to nurse practitioner. Intake nurse had some concerns about the patient expressing his severity of pain in terms of feeling like he wants to die. When nurse practitioner saw him he denied any thoughts of hurting himself or other people but the Police Department was called to the scene for further assessment. Nurse practitioner feels he does not need to be involuntarily committed at this time but she is seeking my input as PCP since she does not have a relationship with this patient before today.   I briefly spoke with the patient, I do not believe he is actively suicidal, he promises to show up to his appointment tomorrow with me. He states the anxiety is a bit better on the new medications that we started. He has an upcoming psychiatric evaluation this Monday. He has been coming to all appointments and seems motivated to seek medical help and follow instructions, though he has reservations about psychiatric evaluation in an emergency room as he does not think that this will help him. He feels he can trust me. He again denied any thoughts of hurting himself, simply states that he is trying to express the level of pain he has been in. I discussed with him my concerns from the standpoint of someone in the medical profession, anytime we are concerned about someone's safety we have a legal and moral obligation to follow through on those concerns. When he exhibits any indication that he feels like dying or wants to hurt himself, this creates concern among the healthcare team. Patient voices understanding.  Patient promises to follow-up with me in the office tomorrow.

## 2017-02-17 ENCOUNTER — Telehealth: Payer: Self-pay | Admitting: Osteopathic Medicine

## 2017-02-17 ENCOUNTER — Ambulatory Visit: Payer: Medicare Other | Admitting: Osteopathic Medicine

## 2017-02-17 DIAGNOSIS — J9611 Chronic respiratory failure with hypoxia: Secondary | ICD-10-CM | POA: Diagnosis not present

## 2017-02-17 DIAGNOSIS — D6851 Activated protein C resistance: Secondary | ICD-10-CM | POA: Diagnosis not present

## 2017-02-17 DIAGNOSIS — L03119 Cellulitis of unspecified part of limb: Secondary | ICD-10-CM | POA: Diagnosis not present

## 2017-02-17 DIAGNOSIS — I890 Lymphedema, not elsewhere classified: Secondary | ICD-10-CM | POA: Diagnosis not present

## 2017-02-17 DIAGNOSIS — G8929 Other chronic pain: Secondary | ICD-10-CM | POA: Diagnosis not present

## 2017-02-17 DIAGNOSIS — E11628 Type 2 diabetes mellitus with other skin complications: Secondary | ICD-10-CM | POA: Diagnosis not present

## 2017-02-17 DIAGNOSIS — M546 Pain in thoracic spine: Secondary | ICD-10-CM | POA: Diagnosis not present

## 2017-02-17 DIAGNOSIS — R5381 Other malaise: Secondary | ICD-10-CM | POA: Insufficient documentation

## 2017-02-17 DIAGNOSIS — L97128 Non-pressure chronic ulcer of left thigh with other specified severity: Secondary | ICD-10-CM | POA: Diagnosis not present

## 2017-02-17 DIAGNOSIS — L97118 Non-pressure chronic ulcer of right thigh with other specified severity: Secondary | ICD-10-CM | POA: Diagnosis not present

## 2017-02-17 DIAGNOSIS — K529 Noninfective gastroenteritis and colitis, unspecified: Secondary | ICD-10-CM | POA: Diagnosis not present

## 2017-02-17 DIAGNOSIS — R197 Diarrhea, unspecified: Secondary | ICD-10-CM | POA: Diagnosis not present

## 2017-02-17 DIAGNOSIS — R45851 Suicidal ideations: Secondary | ICD-10-CM | POA: Diagnosis not present

## 2017-02-17 DIAGNOSIS — E1140 Type 2 diabetes mellitus with diabetic neuropathy, unspecified: Secondary | ICD-10-CM | POA: Diagnosis not present

## 2017-02-17 DIAGNOSIS — E039 Hypothyroidism, unspecified: Secondary | ICD-10-CM | POA: Diagnosis not present

## 2017-02-17 DIAGNOSIS — R531 Weakness: Secondary | ICD-10-CM | POA: Diagnosis not present

## 2017-02-17 DIAGNOSIS — I872 Venous insufficiency (chronic) (peripheral): Secondary | ICD-10-CM | POA: Diagnosis not present

## 2017-02-17 DIAGNOSIS — M545 Low back pain: Secondary | ICD-10-CM | POA: Diagnosis not present

## 2017-02-17 DIAGNOSIS — Z794 Long term (current) use of insulin: Secondary | ICD-10-CM | POA: Diagnosis not present

## 2017-02-17 DIAGNOSIS — Z6841 Body Mass Index (BMI) 40.0 and over, adult: Secondary | ICD-10-CM | POA: Diagnosis not present

## 2017-02-17 DIAGNOSIS — R52 Pain, unspecified: Secondary | ICD-10-CM | POA: Diagnosis not present

## 2017-02-17 NOTE — Telephone Encounter (Signed)
Patient called our office this morning stating he was in severe pain, didn't think he would be able to make his morning appointment, we moved his appointment to later in the morning but he still did not show up for that visit. We have attempted to get home health out to see him today but unfortunately they have not been able to make contact with the patient, voicemail has been full and they will not be able to see him until tomorrow. I personally called [place] [employer] to go perform a safety check on the patient. I called the patient and let him know that someone would be out to check on him and strongly consider calling an ambulance for transport to ER for further evaluation. He states that he is in so much pain that he cannot move and he has no one there to take care of him. [place] [employer] has my cell phone and our office phone number to update me once they have visited the home. Home health will also help [place] arrange social work

## 2017-02-18 DIAGNOSIS — L0390 Cellulitis, unspecified: Secondary | ICD-10-CM | POA: Diagnosis not present

## 2017-02-18 DIAGNOSIS — K529 Noninfective gastroenteritis and colitis, unspecified: Secondary | ICD-10-CM | POA: Diagnosis not present

## 2017-02-18 DIAGNOSIS — G8929 Other chronic pain: Secondary | ICD-10-CM | POA: Diagnosis not present

## 2017-02-18 DIAGNOSIS — I872 Venous insufficiency (chronic) (peripheral): Secondary | ICD-10-CM | POA: Diagnosis not present

## 2017-02-19 DIAGNOSIS — L03119 Cellulitis of unspecified part of limb: Secondary | ICD-10-CM | POA: Diagnosis not present

## 2017-02-19 DIAGNOSIS — R5381 Other malaise: Secondary | ICD-10-CM | POA: Diagnosis not present

## 2017-02-19 DIAGNOSIS — K529 Noninfective gastroenteritis and colitis, unspecified: Secondary | ICD-10-CM | POA: Diagnosis not present

## 2017-02-19 DIAGNOSIS — I872 Venous insufficiency (chronic) (peripheral): Secondary | ICD-10-CM | POA: Diagnosis not present

## 2017-02-19 DIAGNOSIS — Z6841 Body Mass Index (BMI) 40.0 and over, adult: Secondary | ICD-10-CM | POA: Diagnosis not present

## 2017-02-19 DIAGNOSIS — D6851 Activated protein C resistance: Secondary | ICD-10-CM | POA: Diagnosis not present

## 2017-02-19 DIAGNOSIS — I890 Lymphedema, not elsewhere classified: Secondary | ICD-10-CM | POA: Diagnosis not present

## 2017-02-19 DIAGNOSIS — E1140 Type 2 diabetes mellitus with diabetic neuropathy, unspecified: Secondary | ICD-10-CM | POA: Diagnosis not present

## 2017-02-19 DIAGNOSIS — Z794 Long term (current) use of insulin: Secondary | ICD-10-CM | POA: Diagnosis not present

## 2017-02-19 DIAGNOSIS — R197 Diarrhea, unspecified: Secondary | ICD-10-CM | POA: Diagnosis not present

## 2017-02-19 DIAGNOSIS — G8929 Other chronic pain: Secondary | ICD-10-CM | POA: Diagnosis not present

## 2017-02-20 ENCOUNTER — Ambulatory Visit (HOSPITAL_COMMUNITY): Payer: Medicare Other | Admitting: Psychiatry

## 2017-02-20 DIAGNOSIS — L03119 Cellulitis of unspecified part of limb: Secondary | ICD-10-CM | POA: Diagnosis not present

## 2017-02-20 DIAGNOSIS — R5381 Other malaise: Secondary | ICD-10-CM | POA: Diagnosis not present

## 2017-02-20 DIAGNOSIS — Z794 Long term (current) use of insulin: Secondary | ICD-10-CM | POA: Diagnosis not present

## 2017-02-20 DIAGNOSIS — E1140 Type 2 diabetes mellitus with diabetic neuropathy, unspecified: Secondary | ICD-10-CM | POA: Diagnosis not present

## 2017-02-20 DIAGNOSIS — D6851 Activated protein C resistance: Secondary | ICD-10-CM | POA: Diagnosis not present

## 2017-02-20 DIAGNOSIS — Z6841 Body Mass Index (BMI) 40.0 and over, adult: Secondary | ICD-10-CM | POA: Diagnosis not present

## 2017-02-20 DIAGNOSIS — M7989 Other specified soft tissue disorders: Secondary | ICD-10-CM | POA: Diagnosis not present

## 2017-02-20 DIAGNOSIS — I890 Lymphedema, not elsewhere classified: Secondary | ICD-10-CM | POA: Diagnosis not present

## 2017-02-20 DIAGNOSIS — K529 Noninfective gastroenteritis and colitis, unspecified: Secondary | ICD-10-CM | POA: Diagnosis not present

## 2017-02-20 DIAGNOSIS — I82403 Acute embolism and thrombosis of unspecified deep veins of lower extremity, bilateral: Secondary | ICD-10-CM | POA: Diagnosis not present

## 2017-02-20 DIAGNOSIS — G8929 Other chronic pain: Secondary | ICD-10-CM | POA: Diagnosis not present

## 2017-02-21 ENCOUNTER — Ambulatory Visit: Payer: Medicare Other

## 2017-02-21 DIAGNOSIS — I890 Lymphedema, not elsewhere classified: Secondary | ICD-10-CM | POA: Diagnosis not present

## 2017-02-21 DIAGNOSIS — L97811 Non-pressure chronic ulcer of other part of right lower leg limited to breakdown of skin: Secondary | ICD-10-CM | POA: Diagnosis not present

## 2017-02-21 DIAGNOSIS — I519 Heart disease, unspecified: Secondary | ICD-10-CM | POA: Diagnosis not present

## 2017-02-21 DIAGNOSIS — I82413 Acute embolism and thrombosis of femoral vein, bilateral: Secondary | ICD-10-CM | POA: Insufficient documentation

## 2017-02-21 DIAGNOSIS — Z6841 Body Mass Index (BMI) 40.0 and over, adult: Secondary | ICD-10-CM | POA: Diagnosis not present

## 2017-02-21 DIAGNOSIS — L97821 Non-pressure chronic ulcer of other part of left lower leg limited to breakdown of skin: Secondary | ICD-10-CM | POA: Diagnosis not present

## 2017-02-21 DIAGNOSIS — I83018 Varicose veins of right lower extremity with ulcer other part of lower leg: Secondary | ICD-10-CM | POA: Diagnosis not present

## 2017-02-21 DIAGNOSIS — Z794 Long term (current) use of insulin: Secondary | ICD-10-CM | POA: Diagnosis not present

## 2017-02-21 DIAGNOSIS — L03115 Cellulitis of right lower limb: Secondary | ICD-10-CM | POA: Diagnosis not present

## 2017-02-21 DIAGNOSIS — K529 Noninfective gastroenteritis and colitis, unspecified: Secondary | ICD-10-CM | POA: Diagnosis not present

## 2017-02-21 DIAGNOSIS — F329 Major depressive disorder, single episode, unspecified: Secondary | ICD-10-CM | POA: Diagnosis not present

## 2017-02-21 DIAGNOSIS — I83028 Varicose veins of left lower extremity with ulcer other part of lower leg: Secondary | ICD-10-CM | POA: Diagnosis not present

## 2017-02-21 DIAGNOSIS — L03116 Cellulitis of left lower limb: Secondary | ICD-10-CM | POA: Diagnosis not present

## 2017-02-21 DIAGNOSIS — D6851 Activated protein C resistance: Secondary | ICD-10-CM | POA: Diagnosis not present

## 2017-02-21 DIAGNOSIS — I517 Cardiomegaly: Secondary | ICD-10-CM | POA: Diagnosis not present

## 2017-02-21 DIAGNOSIS — R600 Localized edema: Secondary | ICD-10-CM | POA: Diagnosis not present

## 2017-02-21 DIAGNOSIS — I5189 Other ill-defined heart diseases: Secondary | ICD-10-CM | POA: Diagnosis not present

## 2017-02-21 DIAGNOSIS — I081 Rheumatic disorders of both mitral and tricuspid valves: Secondary | ICD-10-CM | POA: Diagnosis not present

## 2017-02-21 DIAGNOSIS — I872 Venous insufficiency (chronic) (peripheral): Secondary | ICD-10-CM | POA: Diagnosis not present

## 2017-02-21 DIAGNOSIS — L03119 Cellulitis of unspecified part of limb: Secondary | ICD-10-CM | POA: Diagnosis not present

## 2017-02-21 DIAGNOSIS — G8929 Other chronic pain: Secondary | ICD-10-CM | POA: Diagnosis not present

## 2017-02-21 DIAGNOSIS — N171 Acute kidney failure with acute cortical necrosis: Secondary | ICD-10-CM | POA: Insufficient documentation

## 2017-02-21 DIAGNOSIS — R5381 Other malaise: Secondary | ICD-10-CM | POA: Diagnosis not present

## 2017-02-21 DIAGNOSIS — E1140 Type 2 diabetes mellitus with diabetic neuropathy, unspecified: Secondary | ICD-10-CM | POA: Diagnosis not present

## 2017-02-22 DIAGNOSIS — Z9981 Dependence on supplemental oxygen: Secondary | ICD-10-CM | POA: Diagnosis not present

## 2017-02-22 DIAGNOSIS — I5021 Acute systolic (congestive) heart failure: Secondary | ICD-10-CM | POA: Diagnosis not present

## 2017-02-22 DIAGNOSIS — I5022 Chronic systolic (congestive) heart failure: Secondary | ICD-10-CM | POA: Diagnosis not present

## 2017-02-22 DIAGNOSIS — I420 Dilated cardiomyopathy: Secondary | ICD-10-CM | POA: Diagnosis not present

## 2017-02-22 DIAGNOSIS — I4892 Unspecified atrial flutter: Secondary | ICD-10-CM | POA: Diagnosis not present

## 2017-02-22 DIAGNOSIS — Z955 Presence of coronary angioplasty implant and graft: Secondary | ICD-10-CM | POA: Insufficient documentation

## 2017-02-22 DIAGNOSIS — I83028 Varicose veins of left lower extremity with ulcer other part of lower leg: Secondary | ICD-10-CM | POA: Diagnosis not present

## 2017-02-22 DIAGNOSIS — Z6841 Body Mass Index (BMI) 40.0 and over, adult: Secondary | ICD-10-CM | POA: Diagnosis not present

## 2017-02-22 DIAGNOSIS — G8929 Other chronic pain: Secondary | ICD-10-CM | POA: Diagnosis not present

## 2017-02-22 DIAGNOSIS — D6851 Activated protein C resistance: Secondary | ICD-10-CM | POA: Diagnosis not present

## 2017-02-22 DIAGNOSIS — I517 Cardiomegaly: Secondary | ICD-10-CM | POA: Diagnosis not present

## 2017-02-22 DIAGNOSIS — L03115 Cellulitis of right lower limb: Secondary | ICD-10-CM | POA: Diagnosis not present

## 2017-02-22 DIAGNOSIS — N179 Acute kidney failure, unspecified: Secondary | ICD-10-CM | POA: Diagnosis not present

## 2017-02-22 DIAGNOSIS — I471 Supraventricular tachycardia: Secondary | ICD-10-CM | POA: Diagnosis not present

## 2017-02-22 DIAGNOSIS — Z794 Long term (current) use of insulin: Secondary | ICD-10-CM | POA: Diagnosis not present

## 2017-02-22 DIAGNOSIS — I83018 Varicose veins of right lower extremity with ulcer other part of lower leg: Secondary | ICD-10-CM | POA: Diagnosis not present

## 2017-02-22 DIAGNOSIS — I2510 Atherosclerotic heart disease of native coronary artery without angina pectoris: Secondary | ICD-10-CM | POA: Diagnosis not present

## 2017-02-22 DIAGNOSIS — E1140 Type 2 diabetes mellitus with diabetic neuropathy, unspecified: Secondary | ICD-10-CM | POA: Diagnosis not present

## 2017-02-22 DIAGNOSIS — N171 Acute kidney failure with acute cortical necrosis: Secondary | ICD-10-CM | POA: Diagnosis not present

## 2017-02-22 DIAGNOSIS — R5381 Other malaise: Secondary | ICD-10-CM | POA: Diagnosis not present

## 2017-02-22 DIAGNOSIS — Z515 Encounter for palliative care: Secondary | ICD-10-CM | POA: Diagnosis not present

## 2017-02-22 DIAGNOSIS — I255 Ischemic cardiomyopathy: Secondary | ICD-10-CM | POA: Insufficient documentation

## 2017-02-22 DIAGNOSIS — I484 Atypical atrial flutter: Secondary | ICD-10-CM | POA: Insufficient documentation

## 2017-02-22 DIAGNOSIS — M48061 Spinal stenosis, lumbar region without neurogenic claudication: Secondary | ICD-10-CM | POA: Diagnosis not present

## 2017-02-22 DIAGNOSIS — G4733 Obstructive sleep apnea (adult) (pediatric): Secondary | ICD-10-CM | POA: Diagnosis not present

## 2017-02-22 DIAGNOSIS — L97811 Non-pressure chronic ulcer of other part of right lower leg limited to breakdown of skin: Secondary | ICD-10-CM | POA: Diagnosis not present

## 2017-02-22 DIAGNOSIS — I890 Lymphedema, not elsewhere classified: Secondary | ICD-10-CM | POA: Diagnosis not present

## 2017-02-22 DIAGNOSIS — K529 Noninfective gastroenteritis and colitis, unspecified: Secondary | ICD-10-CM | POA: Diagnosis not present

## 2017-02-22 DIAGNOSIS — L97821 Non-pressure chronic ulcer of other part of left lower leg limited to breakdown of skin: Secondary | ICD-10-CM | POA: Diagnosis not present

## 2017-02-22 DIAGNOSIS — L03119 Cellulitis of unspecified part of limb: Secondary | ICD-10-CM | POA: Diagnosis not present

## 2017-02-22 DIAGNOSIS — I82413 Acute embolism and thrombosis of femoral vein, bilateral: Secondary | ICD-10-CM | POA: Diagnosis not present

## 2017-02-22 DIAGNOSIS — R0602 Shortness of breath: Secondary | ICD-10-CM | POA: Diagnosis not present

## 2017-02-22 DIAGNOSIS — E039 Hypothyroidism, unspecified: Secondary | ICD-10-CM | POA: Diagnosis not present

## 2017-02-22 DIAGNOSIS — I872 Venous insufficiency (chronic) (peripheral): Secondary | ICD-10-CM | POA: Diagnosis not present

## 2017-02-23 DIAGNOSIS — I82413 Acute embolism and thrombosis of femoral vein, bilateral: Secondary | ICD-10-CM | POA: Diagnosis not present

## 2017-02-23 DIAGNOSIS — Z794 Long term (current) use of insulin: Secondary | ICD-10-CM | POA: Diagnosis not present

## 2017-02-23 DIAGNOSIS — I5021 Acute systolic (congestive) heart failure: Secondary | ICD-10-CM | POA: Diagnosis not present

## 2017-02-23 DIAGNOSIS — R5381 Other malaise: Secondary | ICD-10-CM | POA: Diagnosis not present

## 2017-02-23 DIAGNOSIS — N171 Acute kidney failure with acute cortical necrosis: Secondary | ICD-10-CM | POA: Diagnosis not present

## 2017-02-23 DIAGNOSIS — I484 Atypical atrial flutter: Secondary | ICD-10-CM | POA: Diagnosis not present

## 2017-02-23 DIAGNOSIS — I2510 Atherosclerotic heart disease of native coronary artery without angina pectoris: Secondary | ICD-10-CM | POA: Diagnosis not present

## 2017-02-23 DIAGNOSIS — E039 Hypothyroidism, unspecified: Secondary | ICD-10-CM | POA: Diagnosis not present

## 2017-02-23 DIAGNOSIS — K529 Noninfective gastroenteritis and colitis, unspecified: Secondary | ICD-10-CM | POA: Diagnosis not present

## 2017-02-23 DIAGNOSIS — I83018 Varicose veins of right lower extremity with ulcer other part of lower leg: Secondary | ICD-10-CM | POA: Diagnosis not present

## 2017-02-23 DIAGNOSIS — I872 Venous insufficiency (chronic) (peripheral): Secondary | ICD-10-CM | POA: Diagnosis not present

## 2017-02-23 DIAGNOSIS — Z955 Presence of coronary angioplasty implant and graft: Secondary | ICD-10-CM | POA: Diagnosis not present

## 2017-02-23 DIAGNOSIS — E1140 Type 2 diabetes mellitus with diabetic neuropathy, unspecified: Secondary | ICD-10-CM | POA: Diagnosis not present

## 2017-02-23 DIAGNOSIS — L03119 Cellulitis of unspecified part of limb: Secondary | ICD-10-CM | POA: Diagnosis not present

## 2017-02-23 DIAGNOSIS — I890 Lymphedema, not elsewhere classified: Secondary | ICD-10-CM | POA: Diagnosis not present

## 2017-02-23 DIAGNOSIS — G8929 Other chronic pain: Secondary | ICD-10-CM | POA: Diagnosis not present

## 2017-02-23 DIAGNOSIS — I471 Supraventricular tachycardia: Secondary | ICD-10-CM | POA: Diagnosis not present

## 2017-02-23 DIAGNOSIS — L03115 Cellulitis of right lower limb: Secondary | ICD-10-CM | POA: Diagnosis not present

## 2017-02-23 DIAGNOSIS — L97821 Non-pressure chronic ulcer of other part of left lower leg limited to breakdown of skin: Secondary | ICD-10-CM | POA: Diagnosis not present

## 2017-02-23 DIAGNOSIS — L97811 Non-pressure chronic ulcer of other part of right lower leg limited to breakdown of skin: Secondary | ICD-10-CM | POA: Diagnosis not present

## 2017-02-23 DIAGNOSIS — Z6841 Body Mass Index (BMI) 40.0 and over, adult: Secondary | ICD-10-CM | POA: Diagnosis not present

## 2017-02-23 DIAGNOSIS — I420 Dilated cardiomyopathy: Secondary | ICD-10-CM | POA: Diagnosis not present

## 2017-02-23 DIAGNOSIS — I255 Ischemic cardiomyopathy: Secondary | ICD-10-CM | POA: Diagnosis not present

## 2017-02-23 DIAGNOSIS — I83028 Varicose veins of left lower extremity with ulcer other part of lower leg: Secondary | ICD-10-CM | POA: Diagnosis not present

## 2017-02-23 DIAGNOSIS — Z9981 Dependence on supplemental oxygen: Secondary | ICD-10-CM | POA: Diagnosis not present

## 2017-02-23 DIAGNOSIS — I5022 Chronic systolic (congestive) heart failure: Secondary | ICD-10-CM | POA: Diagnosis not present

## 2017-02-23 DIAGNOSIS — I4892 Unspecified atrial flutter: Secondary | ICD-10-CM | POA: Diagnosis not present

## 2017-02-23 DIAGNOSIS — N179 Acute kidney failure, unspecified: Secondary | ICD-10-CM | POA: Diagnosis not present

## 2017-02-23 DIAGNOSIS — D6851 Activated protein C resistance: Secondary | ICD-10-CM | POA: Diagnosis not present

## 2017-02-24 DIAGNOSIS — R5381 Other malaise: Secondary | ICD-10-CM | POA: Diagnosis not present

## 2017-02-24 DIAGNOSIS — I255 Ischemic cardiomyopathy: Secondary | ICD-10-CM | POA: Diagnosis not present

## 2017-02-24 DIAGNOSIS — Z955 Presence of coronary angioplasty implant and graft: Secondary | ICD-10-CM | POA: Diagnosis not present

## 2017-02-24 DIAGNOSIS — Z7189 Other specified counseling: Secondary | ICD-10-CM | POA: Diagnosis not present

## 2017-02-24 DIAGNOSIS — G8929 Other chronic pain: Secondary | ICD-10-CM | POA: Diagnosis not present

## 2017-02-24 DIAGNOSIS — Z6841 Body Mass Index (BMI) 40.0 and over, adult: Secondary | ICD-10-CM | POA: Diagnosis not present

## 2017-02-24 DIAGNOSIS — L97811 Non-pressure chronic ulcer of other part of right lower leg limited to breakdown of skin: Secondary | ICD-10-CM | POA: Diagnosis not present

## 2017-02-24 DIAGNOSIS — E1140 Type 2 diabetes mellitus with diabetic neuropathy, unspecified: Secondary | ICD-10-CM | POA: Diagnosis not present

## 2017-02-24 DIAGNOSIS — L97821 Non-pressure chronic ulcer of other part of left lower leg limited to breakdown of skin: Secondary | ICD-10-CM | POA: Diagnosis not present

## 2017-02-24 DIAGNOSIS — I82413 Acute embolism and thrombosis of femoral vein, bilateral: Secondary | ICD-10-CM | POA: Diagnosis not present

## 2017-02-24 DIAGNOSIS — L03119 Cellulitis of unspecified part of limb: Secondary | ICD-10-CM | POA: Diagnosis not present

## 2017-02-24 DIAGNOSIS — I872 Venous insufficiency (chronic) (peripheral): Secondary | ICD-10-CM | POA: Diagnosis not present

## 2017-02-24 DIAGNOSIS — N171 Acute kidney failure with acute cortical necrosis: Secondary | ICD-10-CM | POA: Diagnosis not present

## 2017-02-24 DIAGNOSIS — Z515 Encounter for palliative care: Secondary | ICD-10-CM | POA: Diagnosis not present

## 2017-02-24 DIAGNOSIS — I484 Atypical atrial flutter: Secondary | ICD-10-CM | POA: Diagnosis not present

## 2017-02-24 DIAGNOSIS — I83028 Varicose veins of left lower extremity with ulcer other part of lower leg: Secondary | ICD-10-CM | POA: Diagnosis not present

## 2017-02-24 DIAGNOSIS — Z659 Problem related to unspecified psychosocial circumstances: Secondary | ICD-10-CM | POA: Diagnosis not present

## 2017-02-24 DIAGNOSIS — Z794 Long term (current) use of insulin: Secondary | ICD-10-CM | POA: Diagnosis not present

## 2017-02-24 DIAGNOSIS — I2510 Atherosclerotic heart disease of native coronary artery without angina pectoris: Secondary | ICD-10-CM | POA: Diagnosis not present

## 2017-02-24 DIAGNOSIS — I890 Lymphedema, not elsewhere classified: Secondary | ICD-10-CM | POA: Diagnosis not present

## 2017-02-24 DIAGNOSIS — K529 Noninfective gastroenteritis and colitis, unspecified: Secondary | ICD-10-CM | POA: Diagnosis not present

## 2017-02-24 DIAGNOSIS — I83018 Varicose veins of right lower extremity with ulcer other part of lower leg: Secondary | ICD-10-CM | POA: Diagnosis not present

## 2017-02-24 DIAGNOSIS — D6851 Activated protein C resistance: Secondary | ICD-10-CM | POA: Diagnosis not present

## 2017-02-24 DIAGNOSIS — I5022 Chronic systolic (congestive) heart failure: Secondary | ICD-10-CM | POA: Diagnosis not present

## 2017-02-25 DIAGNOSIS — Z6841 Body Mass Index (BMI) 40.0 and over, adult: Secondary | ICD-10-CM | POA: Diagnosis not present

## 2017-02-25 DIAGNOSIS — K529 Noninfective gastroenteritis and colitis, unspecified: Secondary | ICD-10-CM | POA: Diagnosis not present

## 2017-02-25 DIAGNOSIS — D6851 Activated protein C resistance: Secondary | ICD-10-CM | POA: Diagnosis not present

## 2017-02-25 DIAGNOSIS — R5381 Other malaise: Secondary | ICD-10-CM | POA: Diagnosis not present

## 2017-02-25 DIAGNOSIS — Z794 Long term (current) use of insulin: Secondary | ICD-10-CM | POA: Diagnosis not present

## 2017-02-25 DIAGNOSIS — G8929 Other chronic pain: Secondary | ICD-10-CM | POA: Diagnosis not present

## 2017-02-25 DIAGNOSIS — E1140 Type 2 diabetes mellitus with diabetic neuropathy, unspecified: Secondary | ICD-10-CM | POA: Diagnosis not present

## 2017-02-25 DIAGNOSIS — I890 Lymphedema, not elsewhere classified: Secondary | ICD-10-CM | POA: Diagnosis not present

## 2017-02-25 DIAGNOSIS — L03119 Cellulitis of unspecified part of limb: Secondary | ICD-10-CM | POA: Diagnosis not present

## 2017-02-26 DIAGNOSIS — R160 Hepatomegaly, not elsewhere classified: Secondary | ICD-10-CM | POA: Diagnosis not present

## 2017-02-26 DIAGNOSIS — D6851 Activated protein C resistance: Secondary | ICD-10-CM | POA: Diagnosis not present

## 2017-02-26 DIAGNOSIS — R5381 Other malaise: Secondary | ICD-10-CM | POA: Diagnosis not present

## 2017-02-26 DIAGNOSIS — L03119 Cellulitis of unspecified part of limb: Secondary | ICD-10-CM | POA: Diagnosis not present

## 2017-02-26 DIAGNOSIS — K529 Noninfective gastroenteritis and colitis, unspecified: Secondary | ICD-10-CM | POA: Diagnosis not present

## 2017-02-26 DIAGNOSIS — K760 Fatty (change of) liver, not elsewhere classified: Secondary | ICD-10-CM | POA: Diagnosis not present

## 2017-02-26 DIAGNOSIS — E1140 Type 2 diabetes mellitus with diabetic neuropathy, unspecified: Secondary | ICD-10-CM | POA: Diagnosis not present

## 2017-02-26 DIAGNOSIS — D7389 Other diseases of spleen: Secondary | ICD-10-CM | POA: Diagnosis not present

## 2017-02-26 DIAGNOSIS — Z6841 Body Mass Index (BMI) 40.0 and over, adult: Secondary | ICD-10-CM | POA: Diagnosis not present

## 2017-02-26 DIAGNOSIS — G8929 Other chronic pain: Secondary | ICD-10-CM | POA: Diagnosis not present

## 2017-02-26 DIAGNOSIS — N281 Cyst of kidney, acquired: Secondary | ICD-10-CM | POA: Diagnosis not present

## 2017-02-26 DIAGNOSIS — I890 Lymphedema, not elsewhere classified: Secondary | ICD-10-CM | POA: Diagnosis not present

## 2017-02-26 DIAGNOSIS — Z794 Long term (current) use of insulin: Secondary | ICD-10-CM | POA: Diagnosis not present

## 2017-02-27 DIAGNOSIS — I83018 Varicose veins of right lower extremity with ulcer other part of lower leg: Secondary | ICD-10-CM | POA: Diagnosis not present

## 2017-02-27 DIAGNOSIS — I878 Other specified disorders of veins: Secondary | ICD-10-CM | POA: Diagnosis present

## 2017-02-27 DIAGNOSIS — I4891 Unspecified atrial fibrillation: Secondary | ICD-10-CM | POA: Diagnosis not present

## 2017-02-27 DIAGNOSIS — I255 Ischemic cardiomyopathy: Secondary | ICD-10-CM | POA: Diagnosis not present

## 2017-02-27 DIAGNOSIS — Z79899 Other long term (current) drug therapy: Secondary | ICD-10-CM | POA: Diagnosis not present

## 2017-02-27 DIAGNOSIS — I4892 Unspecified atrial flutter: Secondary | ICD-10-CM | POA: Diagnosis present

## 2017-02-27 DIAGNOSIS — R2689 Other abnormalities of gait and mobility: Secondary | ICD-10-CM | POA: Diagnosis not present

## 2017-02-27 DIAGNOSIS — I82403 Acute embolism and thrombosis of unspecified deep veins of lower extremity, bilateral: Secondary | ICD-10-CM | POA: Diagnosis not present

## 2017-02-27 DIAGNOSIS — I498 Other specified cardiac arrhythmias: Secondary | ICD-10-CM | POA: Diagnosis not present

## 2017-02-27 DIAGNOSIS — I5023 Acute on chronic systolic (congestive) heart failure: Secondary | ICD-10-CM | POA: Diagnosis not present

## 2017-02-27 DIAGNOSIS — E668 Other obesity: Secondary | ICD-10-CM | POA: Diagnosis not present

## 2017-02-27 DIAGNOSIS — M6281 Muscle weakness (generalized): Secondary | ICD-10-CM | POA: Diagnosis not present

## 2017-02-27 DIAGNOSIS — Z794 Long term (current) use of insulin: Secondary | ICD-10-CM | POA: Diagnosis not present

## 2017-02-27 DIAGNOSIS — E785 Hyperlipidemia, unspecified: Secondary | ICD-10-CM | POA: Diagnosis present

## 2017-02-27 DIAGNOSIS — I517 Cardiomegaly: Secondary | ICD-10-CM | POA: Diagnosis not present

## 2017-02-27 DIAGNOSIS — I081 Rheumatic disorders of both mitral and tricuspid valves: Secondary | ICD-10-CM | POA: Diagnosis not present

## 2017-02-27 DIAGNOSIS — R740 Nonspecific elevation of levels of transaminase and lactic acid dehydrogenase [LDH]: Secondary | ICD-10-CM | POA: Diagnosis not present

## 2017-02-27 DIAGNOSIS — K529 Noninfective gastroenteritis and colitis, unspecified: Secondary | ICD-10-CM | POA: Diagnosis not present

## 2017-02-27 DIAGNOSIS — D6851 Activated protein C resistance: Secondary | ICD-10-CM | POA: Diagnosis not present

## 2017-02-27 DIAGNOSIS — I5022 Chronic systolic (congestive) heart failure: Secondary | ICD-10-CM | POA: Diagnosis not present

## 2017-02-27 DIAGNOSIS — N171 Acute kidney failure with acute cortical necrosis: Secondary | ICD-10-CM | POA: Diagnosis not present

## 2017-02-27 DIAGNOSIS — E039 Hypothyroidism, unspecified: Secondary | ICD-10-CM | POA: Diagnosis not present

## 2017-02-27 DIAGNOSIS — G4733 Obstructive sleep apnea (adult) (pediatric): Secondary | ICD-10-CM | POA: Diagnosis not present

## 2017-02-27 DIAGNOSIS — L97821 Non-pressure chronic ulcer of other part of left lower leg limited to breakdown of skin: Secondary | ICD-10-CM | POA: Diagnosis not present

## 2017-02-27 DIAGNOSIS — I480 Paroxysmal atrial fibrillation: Secondary | ICD-10-CM | POA: Diagnosis not present

## 2017-02-27 DIAGNOSIS — N179 Acute kidney failure, unspecified: Secondary | ICD-10-CM | POA: Diagnosis not present

## 2017-02-27 DIAGNOSIS — Z91041 Radiographic dye allergy status: Secondary | ICD-10-CM | POA: Diagnosis not present

## 2017-02-27 DIAGNOSIS — E1140 Type 2 diabetes mellitus with diabetic neuropathy, unspecified: Secondary | ICD-10-CM | POA: Diagnosis not present

## 2017-02-27 DIAGNOSIS — I471 Supraventricular tachycardia: Secondary | ICD-10-CM | POA: Diagnosis present

## 2017-02-27 DIAGNOSIS — R931 Abnormal findings on diagnostic imaging of heart and coronary circulation: Secondary | ICD-10-CM | POA: Diagnosis not present

## 2017-02-27 DIAGNOSIS — Z955 Presence of coronary angioplasty implant and graft: Secondary | ICD-10-CM | POA: Diagnosis not present

## 2017-02-27 DIAGNOSIS — Z6841 Body Mass Index (BMI) 40.0 and over, adult: Secondary | ICD-10-CM | POA: Diagnosis not present

## 2017-02-27 DIAGNOSIS — R278 Other lack of coordination: Secondary | ICD-10-CM | POA: Diagnosis not present

## 2017-02-27 DIAGNOSIS — E119 Type 2 diabetes mellitus without complications: Secondary | ICD-10-CM | POA: Diagnosis not present

## 2017-02-27 DIAGNOSIS — L0390 Cellulitis, unspecified: Secondary | ICD-10-CM | POA: Diagnosis not present

## 2017-02-27 DIAGNOSIS — I481 Persistent atrial fibrillation: Secondary | ICD-10-CM | POA: Diagnosis not present

## 2017-02-27 DIAGNOSIS — I82413 Acute embolism and thrombosis of femoral vein, bilateral: Secondary | ICD-10-CM | POA: Diagnosis not present

## 2017-02-27 DIAGNOSIS — Z9989 Dependence on other enabling machines and devices: Secondary | ICD-10-CM | POA: Diagnosis not present

## 2017-02-27 DIAGNOSIS — Z7982 Long term (current) use of aspirin: Secondary | ICD-10-CM | POA: Diagnosis not present

## 2017-02-27 DIAGNOSIS — I083 Combined rheumatic disorders of mitral, aortic and tricuspid valves: Secondary | ICD-10-CM | POA: Diagnosis not present

## 2017-02-27 DIAGNOSIS — I484 Atypical atrial flutter: Secondary | ICD-10-CM | POA: Diagnosis not present

## 2017-02-27 DIAGNOSIS — I110 Hypertensive heart disease with heart failure: Secondary | ICD-10-CM | POA: Diagnosis present

## 2017-02-27 DIAGNOSIS — F419 Anxiety disorder, unspecified: Secondary | ICD-10-CM | POA: Diagnosis not present

## 2017-02-27 DIAGNOSIS — Z9981 Dependence on supplemental oxygen: Secondary | ICD-10-CM | POA: Diagnosis not present

## 2017-02-27 DIAGNOSIS — I890 Lymphedema, not elsewhere classified: Secondary | ICD-10-CM | POA: Diagnosis not present

## 2017-02-27 DIAGNOSIS — I83028 Varicose veins of left lower extremity with ulcer other part of lower leg: Secondary | ICD-10-CM | POA: Diagnosis not present

## 2017-02-27 DIAGNOSIS — G8929 Other chronic pain: Secondary | ICD-10-CM | POA: Diagnosis not present

## 2017-02-27 DIAGNOSIS — I2510 Atherosclerotic heart disease of native coronary artery without angina pectoris: Secondary | ICD-10-CM | POA: Diagnosis not present

## 2017-02-27 DIAGNOSIS — I5021 Acute systolic (congestive) heart failure: Secondary | ICD-10-CM | POA: Diagnosis not present

## 2017-02-27 DIAGNOSIS — I509 Heart failure, unspecified: Secondary | ICD-10-CM | POA: Diagnosis not present

## 2017-02-27 DIAGNOSIS — Z7409 Other reduced mobility: Secondary | ICD-10-CM | POA: Diagnosis not present

## 2017-02-27 DIAGNOSIS — I739 Peripheral vascular disease, unspecified: Secondary | ICD-10-CM | POA: Diagnosis not present

## 2017-02-27 DIAGNOSIS — I420 Dilated cardiomyopathy: Secondary | ICD-10-CM | POA: Diagnosis not present

## 2017-02-27 DIAGNOSIS — I10 Essential (primary) hypertension: Secondary | ICD-10-CM | POA: Diagnosis not present

## 2017-02-27 DIAGNOSIS — K219 Gastro-esophageal reflux disease without esophagitis: Secondary | ICD-10-CM | POA: Diagnosis present

## 2017-02-27 DIAGNOSIS — I872 Venous insufficiency (chronic) (peripheral): Secondary | ICD-10-CM | POA: Diagnosis not present

## 2017-02-27 DIAGNOSIS — L97811 Non-pressure chronic ulcer of other part of right lower leg limited to breakdown of skin: Secondary | ICD-10-CM | POA: Diagnosis not present

## 2017-02-27 DIAGNOSIS — F329 Major depressive disorder, single episode, unspecified: Secondary | ICD-10-CM | POA: Diagnosis not present

## 2017-02-27 DIAGNOSIS — L03119 Cellulitis of unspecified part of limb: Secondary | ICD-10-CM | POA: Diagnosis not present

## 2017-02-27 DIAGNOSIS — Z86711 Personal history of pulmonary embolism: Secondary | ICD-10-CM | POA: Diagnosis not present

## 2017-02-27 DIAGNOSIS — R600 Localized edema: Secondary | ICD-10-CM | POA: Diagnosis not present

## 2017-02-27 DIAGNOSIS — I82513 Chronic embolism and thrombosis of femoral vein, bilateral: Secondary | ICD-10-CM | POA: Diagnosis not present

## 2017-03-14 DIAGNOSIS — Z5181 Encounter for therapeutic drug level monitoring: Secondary | ICD-10-CM | POA: Diagnosis not present

## 2017-03-14 DIAGNOSIS — M6281 Muscle weakness (generalized): Secondary | ICD-10-CM | POA: Diagnosis not present

## 2017-03-14 DIAGNOSIS — I739 Peripheral vascular disease, unspecified: Secondary | ICD-10-CM | POA: Diagnosis not present

## 2017-03-14 DIAGNOSIS — I420 Dilated cardiomyopathy: Secondary | ICD-10-CM | POA: Diagnosis not present

## 2017-03-14 DIAGNOSIS — I83028 Varicose veins of left lower extremity with ulcer other part of lower leg: Secondary | ICD-10-CM | POA: Diagnosis not present

## 2017-03-14 DIAGNOSIS — E118 Type 2 diabetes mellitus with unspecified complications: Secondary | ICD-10-CM | POA: Diagnosis not present

## 2017-03-14 DIAGNOSIS — I5023 Acute on chronic systolic (congestive) heart failure: Secondary | ICD-10-CM | POA: Diagnosis not present

## 2017-03-14 DIAGNOSIS — Z9981 Dependence on supplemental oxygen: Secondary | ICD-10-CM | POA: Diagnosis not present

## 2017-03-14 DIAGNOSIS — I83015 Varicose veins of right lower extremity with ulcer other part of foot: Secondary | ICD-10-CM | POA: Diagnosis not present

## 2017-03-14 DIAGNOSIS — I110 Hypertensive heart disease with heart failure: Secondary | ICD-10-CM | POA: Diagnosis not present

## 2017-03-14 DIAGNOSIS — I4891 Unspecified atrial fibrillation: Secondary | ICD-10-CM | POA: Diagnosis not present

## 2017-03-14 DIAGNOSIS — I10 Essential (primary) hypertension: Secondary | ICD-10-CM | POA: Diagnosis not present

## 2017-03-14 DIAGNOSIS — D6851 Activated protein C resistance: Secondary | ICD-10-CM | POA: Diagnosis not present

## 2017-03-14 DIAGNOSIS — R7309 Other abnormal glucose: Secondary | ICD-10-CM | POA: Diagnosis not present

## 2017-03-14 DIAGNOSIS — Z515 Encounter for palliative care: Secondary | ICD-10-CM | POA: Diagnosis not present

## 2017-03-14 DIAGNOSIS — I255 Ischemic cardiomyopathy: Secondary | ICD-10-CM | POA: Diagnosis not present

## 2017-03-14 DIAGNOSIS — I2699 Other pulmonary embolism without acute cor pulmonale: Secondary | ICD-10-CM | POA: Diagnosis not present

## 2017-03-14 DIAGNOSIS — Z7409 Other reduced mobility: Secondary | ICD-10-CM | POA: Diagnosis not present

## 2017-03-14 DIAGNOSIS — M79604 Pain in right leg: Secondary | ICD-10-CM | POA: Diagnosis not present

## 2017-03-14 DIAGNOSIS — I498 Other specified cardiac arrhythmias: Secondary | ICD-10-CM | POA: Diagnosis not present

## 2017-03-14 DIAGNOSIS — R278 Other lack of coordination: Secondary | ICD-10-CM | POA: Diagnosis not present

## 2017-03-14 DIAGNOSIS — Z79899 Other long term (current) drug therapy: Secondary | ICD-10-CM | POA: Diagnosis not present

## 2017-03-14 DIAGNOSIS — I2510 Atherosclerotic heart disease of native coronary artery without angina pectoris: Secondary | ICD-10-CM | POA: Diagnosis not present

## 2017-03-14 DIAGNOSIS — R7301 Impaired fasting glucose: Secondary | ICD-10-CM | POA: Diagnosis not present

## 2017-03-14 DIAGNOSIS — F321 Major depressive disorder, single episode, moderate: Secondary | ICD-10-CM | POA: Diagnosis not present

## 2017-03-14 DIAGNOSIS — R2689 Other abnormalities of gait and mobility: Secondary | ICD-10-CM | POA: Diagnosis not present

## 2017-03-14 DIAGNOSIS — E038 Other specified hypothyroidism: Secondary | ICD-10-CM | POA: Diagnosis not present

## 2017-03-14 DIAGNOSIS — F331 Major depressive disorder, recurrent, moderate: Secondary | ICD-10-CM | POA: Diagnosis not present

## 2017-03-14 DIAGNOSIS — F411 Generalized anxiety disorder: Secondary | ICD-10-CM | POA: Diagnosis not present

## 2017-03-14 DIAGNOSIS — I83018 Varicose veins of right lower extremity with ulcer other part of lower leg: Secondary | ICD-10-CM | POA: Diagnosis not present

## 2017-03-14 DIAGNOSIS — I824Y9 Acute embolism and thrombosis of unspecified deep veins of unspecified proximal lower extremity: Secondary | ICD-10-CM | POA: Diagnosis not present

## 2017-03-14 DIAGNOSIS — I5022 Chronic systolic (congestive) heart failure: Secondary | ICD-10-CM | POA: Diagnosis not present

## 2017-03-14 DIAGNOSIS — I83014 Varicose veins of right lower extremity with ulcer of heel and midfoot: Secondary | ICD-10-CM | POA: Diagnosis not present

## 2017-03-14 DIAGNOSIS — Z955 Presence of coronary angioplasty implant and graft: Secondary | ICD-10-CM | POA: Diagnosis not present

## 2017-03-14 DIAGNOSIS — I5021 Acute systolic (congestive) heart failure: Secondary | ICD-10-CM | POA: Diagnosis not present

## 2017-03-14 DIAGNOSIS — E876 Hypokalemia: Secondary | ICD-10-CM | POA: Diagnosis not present

## 2017-03-15 DIAGNOSIS — E118 Type 2 diabetes mellitus with unspecified complications: Secondary | ICD-10-CM | POA: Diagnosis not present

## 2017-03-15 DIAGNOSIS — D6851 Activated protein C resistance: Secondary | ICD-10-CM | POA: Diagnosis not present

## 2017-03-15 DIAGNOSIS — I420 Dilated cardiomyopathy: Secondary | ICD-10-CM | POA: Diagnosis not present

## 2017-03-15 DIAGNOSIS — I2699 Other pulmonary embolism without acute cor pulmonale: Secondary | ICD-10-CM | POA: Diagnosis not present

## 2017-03-16 DIAGNOSIS — M79604 Pain in right leg: Secondary | ICD-10-CM | POA: Diagnosis not present

## 2017-03-21 DIAGNOSIS — Z79899 Other long term (current) drug therapy: Secondary | ICD-10-CM | POA: Diagnosis not present

## 2017-03-21 DIAGNOSIS — I824Y9 Acute embolism and thrombosis of unspecified deep veins of unspecified proximal lower extremity: Secondary | ICD-10-CM | POA: Diagnosis not present

## 2017-03-21 DIAGNOSIS — Z5181 Encounter for therapeutic drug level monitoring: Secondary | ICD-10-CM | POA: Diagnosis not present

## 2017-03-21 DIAGNOSIS — I2510 Atherosclerotic heart disease of native coronary artery without angina pectoris: Secondary | ICD-10-CM | POA: Diagnosis not present

## 2017-03-21 DIAGNOSIS — I5022 Chronic systolic (congestive) heart failure: Secondary | ICD-10-CM | POA: Diagnosis not present

## 2017-03-21 DIAGNOSIS — Z515 Encounter for palliative care: Secondary | ICD-10-CM | POA: Diagnosis not present

## 2017-03-21 DIAGNOSIS — I420 Dilated cardiomyopathy: Secondary | ICD-10-CM | POA: Diagnosis not present

## 2017-03-21 DIAGNOSIS — I10 Essential (primary) hypertension: Secondary | ICD-10-CM | POA: Diagnosis not present

## 2017-03-21 DIAGNOSIS — I255 Ischemic cardiomyopathy: Secondary | ICD-10-CM | POA: Diagnosis not present

## 2017-03-21 DIAGNOSIS — D6851 Activated protein C resistance: Secondary | ICD-10-CM | POA: Diagnosis not present

## 2017-03-22 DIAGNOSIS — E038 Other specified hypothyroidism: Secondary | ICD-10-CM | POA: Diagnosis not present

## 2017-03-22 DIAGNOSIS — R7301 Impaired fasting glucose: Secondary | ICD-10-CM | POA: Diagnosis not present

## 2017-03-22 DIAGNOSIS — R7309 Other abnormal glucose: Secondary | ICD-10-CM | POA: Diagnosis not present

## 2017-03-24 DIAGNOSIS — E038 Other specified hypothyroidism: Secondary | ICD-10-CM | POA: Diagnosis not present

## 2017-03-24 DIAGNOSIS — I10 Essential (primary) hypertension: Secondary | ICD-10-CM | POA: Diagnosis not present

## 2017-03-24 DIAGNOSIS — E876 Hypokalemia: Secondary | ICD-10-CM | POA: Diagnosis not present

## 2017-03-24 DIAGNOSIS — I420 Dilated cardiomyopathy: Secondary | ICD-10-CM | POA: Diagnosis not present

## 2017-03-30 DIAGNOSIS — R7301 Impaired fasting glucose: Secondary | ICD-10-CM | POA: Diagnosis not present

## 2017-03-30 DIAGNOSIS — R7309 Other abnormal glucose: Secondary | ICD-10-CM | POA: Diagnosis not present

## 2017-03-31 DIAGNOSIS — I2699 Other pulmonary embolism without acute cor pulmonale: Secondary | ICD-10-CM | POA: Diagnosis not present

## 2017-03-31 DIAGNOSIS — I420 Dilated cardiomyopathy: Secondary | ICD-10-CM | POA: Diagnosis not present

## 2017-03-31 DIAGNOSIS — E118 Type 2 diabetes mellitus with unspecified complications: Secondary | ICD-10-CM | POA: Diagnosis not present

## 2017-03-31 DIAGNOSIS — D6851 Activated protein C resistance: Secondary | ICD-10-CM | POA: Diagnosis not present

## 2017-04-02 DIAGNOSIS — L97912 Non-pressure chronic ulcer of unspecified part of right lower leg with fat layer exposed: Secondary | ICD-10-CM | POA: Diagnosis not present

## 2017-04-02 DIAGNOSIS — G894 Chronic pain syndrome: Secondary | ICD-10-CM | POA: Diagnosis not present

## 2017-04-02 DIAGNOSIS — I110 Hypertensive heart disease with heart failure: Secondary | ICD-10-CM | POA: Diagnosis not present

## 2017-04-02 DIAGNOSIS — L97922 Non-pressure chronic ulcer of unspecified part of left lower leg with fat layer exposed: Secondary | ICD-10-CM | POA: Diagnosis not present

## 2017-04-02 DIAGNOSIS — E11622 Type 2 diabetes mellitus with other skin ulcer: Secondary | ICD-10-CM | POA: Diagnosis not present

## 2017-04-02 DIAGNOSIS — I5023 Acute on chronic systolic (congestive) heart failure: Secondary | ICD-10-CM | POA: Diagnosis not present

## 2017-04-04 DIAGNOSIS — G894 Chronic pain syndrome: Secondary | ICD-10-CM | POA: Diagnosis not present

## 2017-04-04 DIAGNOSIS — L97912 Non-pressure chronic ulcer of unspecified part of right lower leg with fat layer exposed: Secondary | ICD-10-CM | POA: Diagnosis not present

## 2017-04-04 DIAGNOSIS — L97922 Non-pressure chronic ulcer of unspecified part of left lower leg with fat layer exposed: Secondary | ICD-10-CM | POA: Diagnosis not present

## 2017-04-04 DIAGNOSIS — Z5181 Encounter for therapeutic drug level monitoring: Secondary | ICD-10-CM | POA: Diagnosis not present

## 2017-04-04 DIAGNOSIS — Z79899 Other long term (current) drug therapy: Secondary | ICD-10-CM | POA: Diagnosis not present

## 2017-04-04 DIAGNOSIS — I5023 Acute on chronic systolic (congestive) heart failure: Secondary | ICD-10-CM | POA: Diagnosis not present

## 2017-04-04 DIAGNOSIS — E11622 Type 2 diabetes mellitus with other skin ulcer: Secondary | ICD-10-CM | POA: Diagnosis not present

## 2017-04-04 DIAGNOSIS — R0602 Shortness of breath: Secondary | ICD-10-CM | POA: Diagnosis not present

## 2017-04-04 DIAGNOSIS — I110 Hypertensive heart disease with heart failure: Secondary | ICD-10-CM | POA: Diagnosis not present

## 2017-04-05 DIAGNOSIS — E11622 Type 2 diabetes mellitus with other skin ulcer: Secondary | ICD-10-CM | POA: Diagnosis not present

## 2017-04-05 DIAGNOSIS — G894 Chronic pain syndrome: Secondary | ICD-10-CM | POA: Diagnosis not present

## 2017-04-05 DIAGNOSIS — L97922 Non-pressure chronic ulcer of unspecified part of left lower leg with fat layer exposed: Secondary | ICD-10-CM | POA: Diagnosis not present

## 2017-04-05 DIAGNOSIS — I5023 Acute on chronic systolic (congestive) heart failure: Secondary | ICD-10-CM | POA: Diagnosis not present

## 2017-04-05 DIAGNOSIS — L97912 Non-pressure chronic ulcer of unspecified part of right lower leg with fat layer exposed: Secondary | ICD-10-CM | POA: Diagnosis not present

## 2017-04-05 DIAGNOSIS — I110 Hypertensive heart disease with heart failure: Secondary | ICD-10-CM | POA: Diagnosis not present

## 2017-04-06 DIAGNOSIS — L97912 Non-pressure chronic ulcer of unspecified part of right lower leg with fat layer exposed: Secondary | ICD-10-CM | POA: Diagnosis not present

## 2017-04-06 DIAGNOSIS — I110 Hypertensive heart disease with heart failure: Secondary | ICD-10-CM | POA: Diagnosis not present

## 2017-04-06 DIAGNOSIS — G894 Chronic pain syndrome: Secondary | ICD-10-CM | POA: Diagnosis not present

## 2017-04-06 DIAGNOSIS — I5023 Acute on chronic systolic (congestive) heart failure: Secondary | ICD-10-CM | POA: Diagnosis not present

## 2017-04-06 DIAGNOSIS — L97922 Non-pressure chronic ulcer of unspecified part of left lower leg with fat layer exposed: Secondary | ICD-10-CM | POA: Diagnosis not present

## 2017-04-06 DIAGNOSIS — E11622 Type 2 diabetes mellitus with other skin ulcer: Secondary | ICD-10-CM | POA: Diagnosis not present

## 2017-04-07 DIAGNOSIS — L97922 Non-pressure chronic ulcer of unspecified part of left lower leg with fat layer exposed: Secondary | ICD-10-CM | POA: Diagnosis not present

## 2017-04-07 DIAGNOSIS — E11622 Type 2 diabetes mellitus with other skin ulcer: Secondary | ICD-10-CM | POA: Diagnosis not present

## 2017-04-07 DIAGNOSIS — L97912 Non-pressure chronic ulcer of unspecified part of right lower leg with fat layer exposed: Secondary | ICD-10-CM | POA: Diagnosis not present

## 2017-04-07 DIAGNOSIS — I110 Hypertensive heart disease with heart failure: Secondary | ICD-10-CM | POA: Diagnosis not present

## 2017-04-07 DIAGNOSIS — I5023 Acute on chronic systolic (congestive) heart failure: Secondary | ICD-10-CM | POA: Diagnosis not present

## 2017-04-07 DIAGNOSIS — G894 Chronic pain syndrome: Secondary | ICD-10-CM | POA: Diagnosis not present

## 2017-04-10 DIAGNOSIS — R402362 Coma scale, best motor response, obeys commands, at arrival to emergency department: Secondary | ICD-10-CM | POA: Diagnosis not present

## 2017-04-10 DIAGNOSIS — R402252 Coma scale, best verbal response, oriented, at arrival to emergency department: Secondary | ICD-10-CM | POA: Diagnosis not present

## 2017-04-10 DIAGNOSIS — N390 Urinary tract infection, site not specified: Secondary | ICD-10-CM | POA: Diagnosis not present

## 2017-04-10 DIAGNOSIS — B9689 Other specified bacterial agents as the cause of diseases classified elsewhere: Secondary | ICD-10-CM | POA: Diagnosis not present

## 2017-04-10 DIAGNOSIS — E039 Hypothyroidism, unspecified: Secondary | ICD-10-CM | POA: Diagnosis not present

## 2017-04-10 DIAGNOSIS — Z91041 Radiographic dye allergy status: Secondary | ICD-10-CM | POA: Diagnosis not present

## 2017-04-10 DIAGNOSIS — Z9981 Dependence on supplemental oxygen: Secondary | ICD-10-CM | POA: Diagnosis not present

## 2017-04-10 DIAGNOSIS — Z79899 Other long term (current) drug therapy: Secondary | ICD-10-CM | POA: Diagnosis not present

## 2017-04-10 DIAGNOSIS — R402142 Coma scale, eyes open, spontaneous, at arrival to emergency department: Secondary | ICD-10-CM | POA: Diagnosis not present

## 2017-04-10 DIAGNOSIS — E1140 Type 2 diabetes mellitus with diabetic neuropathy, unspecified: Secondary | ICD-10-CM | POA: Diagnosis not present

## 2017-04-10 DIAGNOSIS — Z7982 Long term (current) use of aspirin: Secondary | ICD-10-CM | POA: Diagnosis not present

## 2017-04-10 DIAGNOSIS — Z7901 Long term (current) use of anticoagulants: Secondary | ICD-10-CM | POA: Diagnosis not present

## 2017-04-10 DIAGNOSIS — M545 Low back pain: Secondary | ICD-10-CM | POA: Diagnosis not present

## 2017-04-10 DIAGNOSIS — I872 Venous insufficiency (chronic) (peripheral): Secondary | ICD-10-CM | POA: Diagnosis not present

## 2017-04-10 DIAGNOSIS — G894 Chronic pain syndrome: Secondary | ICD-10-CM | POA: Diagnosis not present

## 2017-04-10 DIAGNOSIS — R319 Hematuria, unspecified: Secondary | ICD-10-CM | POA: Diagnosis not present

## 2017-04-10 DIAGNOSIS — K219 Gastro-esophageal reflux disease without esophagitis: Secondary | ICD-10-CM | POA: Diagnosis not present

## 2017-04-10 DIAGNOSIS — G8929 Other chronic pain: Secondary | ICD-10-CM | POA: Diagnosis not present

## 2017-04-12 DIAGNOSIS — E11622 Type 2 diabetes mellitus with other skin ulcer: Secondary | ICD-10-CM | POA: Diagnosis not present

## 2017-04-12 DIAGNOSIS — L97922 Non-pressure chronic ulcer of unspecified part of left lower leg with fat layer exposed: Secondary | ICD-10-CM | POA: Diagnosis not present

## 2017-04-12 DIAGNOSIS — I5023 Acute on chronic systolic (congestive) heart failure: Secondary | ICD-10-CM | POA: Diagnosis not present

## 2017-04-12 DIAGNOSIS — L97912 Non-pressure chronic ulcer of unspecified part of right lower leg with fat layer exposed: Secondary | ICD-10-CM | POA: Diagnosis not present

## 2017-04-12 DIAGNOSIS — G894 Chronic pain syndrome: Secondary | ICD-10-CM | POA: Diagnosis not present

## 2017-04-12 DIAGNOSIS — I110 Hypertensive heart disease with heart failure: Secondary | ICD-10-CM | POA: Diagnosis not present

## 2017-04-13 ENCOUNTER — Ambulatory Visit (INDEPENDENT_AMBULATORY_CARE_PROVIDER_SITE_OTHER): Payer: Medicare Other | Admitting: Osteopathic Medicine

## 2017-04-13 ENCOUNTER — Telehealth: Payer: Self-pay

## 2017-04-13 ENCOUNTER — Encounter: Payer: Self-pay | Admitting: Osteopathic Medicine

## 2017-04-13 VITALS — BP 147/81 | HR 98 | Ht 70.0 in

## 2017-04-13 DIAGNOSIS — I484 Atypical atrial flutter: Secondary | ICD-10-CM

## 2017-04-13 DIAGNOSIS — E039 Hypothyroidism, unspecified: Secondary | ICD-10-CM

## 2017-04-13 DIAGNOSIS — Z794 Long term (current) use of insulin: Secondary | ICD-10-CM

## 2017-04-13 DIAGNOSIS — E1165 Type 2 diabetes mellitus with hyperglycemia: Secondary | ICD-10-CM

## 2017-04-13 DIAGNOSIS — E1159 Type 2 diabetes mellitus with other circulatory complications: Secondary | ICD-10-CM | POA: Diagnosis not present

## 2017-04-13 DIAGNOSIS — I10 Essential (primary) hypertension: Secondary | ICD-10-CM | POA: Diagnosis not present

## 2017-04-13 DIAGNOSIS — G4733 Obstructive sleep apnea (adult) (pediatric): Secondary | ICD-10-CM

## 2017-04-13 DIAGNOSIS — I255 Ischemic cardiomyopathy: Secondary | ICD-10-CM

## 2017-04-13 DIAGNOSIS — G894 Chronic pain syndrome: Secondary | ICD-10-CM | POA: Diagnosis not present

## 2017-04-13 DIAGNOSIS — N390 Urinary tract infection, site not specified: Secondary | ICD-10-CM | POA: Diagnosis not present

## 2017-04-13 DIAGNOSIS — F411 Generalized anxiety disorder: Secondary | ICD-10-CM

## 2017-04-13 DIAGNOSIS — I739 Peripheral vascular disease, unspecified: Secondary | ICD-10-CM | POA: Diagnosis not present

## 2017-04-13 DIAGNOSIS — F332 Major depressive disorder, recurrent severe without psychotic features: Secondary | ICD-10-CM

## 2017-04-13 DIAGNOSIS — I872 Venous insufficiency (chronic) (peripheral): Secondary | ICD-10-CM | POA: Diagnosis not present

## 2017-04-13 DIAGNOSIS — D6851 Activated protein C resistance: Secondary | ICD-10-CM

## 2017-04-13 DIAGNOSIS — G8929 Other chronic pain: Secondary | ICD-10-CM | POA: Diagnosis not present

## 2017-04-13 DIAGNOSIS — I420 Dilated cardiomyopathy: Secondary | ICD-10-CM

## 2017-04-13 DIAGNOSIS — E118 Type 2 diabetes mellitus with unspecified complications: Secondary | ICD-10-CM

## 2017-04-13 DIAGNOSIS — IMO0002 Reserved for concepts with insufficient information to code with codable children: Secondary | ICD-10-CM

## 2017-04-13 DIAGNOSIS — R5381 Other malaise: Secondary | ICD-10-CM

## 2017-04-13 DIAGNOSIS — I2510 Atherosclerotic heart disease of native coronary artery without angina pectoris: Secondary | ICD-10-CM

## 2017-04-13 DIAGNOSIS — I82519 Chronic embolism and thrombosis of unspecified femoral vein: Secondary | ICD-10-CM | POA: Diagnosis not present

## 2017-04-13 LAB — CBC WITH DIFFERENTIAL/PLATELET
BASOS ABS: 87 {cells}/uL (ref 0–200)
Basophils Relative: 1 %
EOS ABS: 261 {cells}/uL (ref 15–500)
Eosinophils Relative: 3 %
HCT: 48.2 % (ref 38.5–50.0)
HEMOGLOBIN: 15.8 g/dL (ref 13.2–17.1)
Lymphocytes Relative: 21 %
Lymphs Abs: 1827 cells/uL (ref 850–3900)
MCH: 28.4 pg (ref 27.0–33.0)
MCHC: 32.8 g/dL (ref 32.0–36.0)
MCV: 86.7 fL (ref 80.0–100.0)
MONOS PCT: 8 %
MPV: 11.6 fL (ref 7.5–12.5)
Monocytes Absolute: 696 cells/uL (ref 200–950)
NEUTROS PCT: 67 %
Neutro Abs: 5829 cells/uL (ref 1500–7800)
PLATELETS: 302 10*3/uL (ref 140–400)
RBC: 5.56 MIL/uL (ref 4.20–5.80)
RDW: 15.7 % — ABNORMAL HIGH (ref 11.0–15.0)
WBC: 8.7 10*3/uL (ref 3.8–10.8)

## 2017-04-13 LAB — COMPLETE METABOLIC PANEL WITH GFR
AG RATIO: 1.4 ratio (ref 1.0–2.5)
ALBUMIN: 4 g/dL (ref 3.6–5.1)
ALK PHOS: 95 U/L (ref 40–115)
ALT: 10 U/L (ref 9–46)
AST: 15 U/L (ref 10–35)
BILIRUBIN TOTAL: 1.2 mg/dL (ref 0.2–1.2)
BUN / CREAT RATIO: 22.4 ratio — AB (ref 6–22)
BUN: 28 mg/dL — ABNORMAL HIGH (ref 7–25)
CO2: 26 mmol/L (ref 20–31)
Calcium: 9.6 mg/dL (ref 8.6–10.3)
Chloride: 96 mmol/L — ABNORMAL LOW (ref 98–110)
Creat: 1.25 mg/dL (ref 0.70–1.33)
GFR, Est African American: 77 mL/min (ref >=60)
GFR, Est Non African American: 66 mL/min (ref >=60)
GLOBULIN: 2.8 g/dL (ref 1.9–3.7)
Glucose, Bld: 255 mg/dL — ABNORMAL HIGH (ref 65–99)
POTASSIUM: 4.3 mmol/L (ref 3.5–5.3)
SODIUM: 142 mmol/L (ref 135–146)
TOTAL PROTEIN: 6.8 g/dL (ref 6.1–8.1)

## 2017-04-13 LAB — POCT URINALYSIS DIPSTICK
BILIRUBIN UA: NEGATIVE
Glucose, UA: NEGATIVE
KETONES UA: NEGATIVE
Leukocytes, UA: NEGATIVE
Nitrite, UA: NEGATIVE
Protein, UA: NEGATIVE
RBC UA: NEGATIVE
SPEC GRAV UA: 1.01 (ref 1.010–1.025)
UROBILINOGEN UA: 0.2 U/dL
pH, UA: 6 (ref 5.0–8.0)

## 2017-04-13 LAB — POCT GLYCOSYLATED HEMOGLOBIN (HGB A1C): Hemoglobin A1C: 9.9

## 2017-04-13 LAB — TSH: TSH: 7.51 m[IU]/L — AB (ref 0.40–4.50)

## 2017-04-13 MED ORDER — DULOXETINE HCL 30 MG PO CPEP: 30.0000 mg | ORAL_CAPSULE | Freq: Every day | ORAL | 1 refills | Status: DC

## 2017-04-13 MED ORDER — OXYCODONE-ACETAMINOPHEN 10-325 MG PO TABS: 1.0000 | ORAL_TABLET | Freq: Three times a day (TID) | ORAL | 0 refills | Status: DC | PRN

## 2017-04-13 MED ORDER — DULOXETINE HCL 60 MG PO CPEP: 60.0000 mg | ORAL_CAPSULE | Freq: Two times a day (BID) | ORAL | 3 refills | Status: DC

## 2017-04-13 NOTE — Progress Notes (Signed)
HPI: Mark Dean is a 51 y.o. male  who presents to [facility] today, 04/13/17,  for chief complaint of:  Chief Complaint  Patient presents with  . Hospitalization Follow-up    Here for hospitalization follow up - complicated hospital and rehab course w/ DVT, CHF, Afib, cellulitis most concerning medical problems. He is unclear on medications he is taking at this point - list he brings wit his doesn't line up with recent cardiology visit - I don't have rehab list at this time. Insulin isn;t even on the list.   Additionally - home health is concerned about state of the patient's house and is refusing services due to hygiene problems, fecal matter about the house, pt states he is in too much pain to get to hte bathroom sometimes but "it's not that bad." He hasn't heard anything about home health d/c-ing services until I tell him our office was contacted with this concern.   CARDIOVASCULAR -  Recent hospitalization and multiple cardiac complications - is following with cardio outpatient but had to reschedule recent appt - unable to leave the house d/t pain. Recent cardiology notes reviewed, 03/21/2017.  Dilated CM:  - Echo 11/2015: LVEF 30-35%. RV mildly reduced. LVIDd 6.8 cm.  - Echo 02/2017: LVEF <25%. RV mod dilated. 1+ MR. 2-3+ TR - torsemide 40 mg has improved fluid status, ok to continue as long as renal indices stable - Continue digoxin, carvedilol and hydralazine. Titrate hydralazine to 25 mg twice daily... consider addition of ACe-i/ARB if renal indices remained stable. Atrial tach versus flutter DCCV 02/2017 Cardio plans to continue amiodarone.  DVT & Hx PE with factor V Leiden mutation Continue Xarelto. CAD LHC 12/17/15: LAD with 50% mid and 95% mid lesion s/p 3.5x15 mm Vision BMS to 0% residual. PDA with 60% osteal stenosis otherwise normal.  RESPIRATORY  SOB with anxiety - pain and anxiety are debilitating for this patient. He is convinced  that medications will not help. No orthopnea. PFTs with DLCO have been ordered by cardiology  OSA w/ CPAP  NEUROLOGICAL/PSYCHIATRIC Chronic pain and LE weakness interferes with ADL as noted above. Patient has either developed tolerance to or is convinced that opiates have not been helpful, adjuntive therapies such as Cymbalta have been added, pt unable to tolerate gabapentin/Lyrica in the past. Interestingly, he reluctantly admits that his mother once mailed him a package of his father's tihngs after his father died, in it was "some kind of pain patch I tried" which turned out to be Fentanyl - amazingly he put it on and didn't suffer any adverse events.  Anxiety/Depression: significant comorbidity. Pt is much calmer today than at previous visits where he was stating pain was so bad he wanted to die. Has not expressed suicidal plan. Has bene resistant to psychiatric intervention/referral  RENAL Watching renal fxn closely on new medication regimen   ENDOCRINE DM2: recently back on insulin - he is unclear which insulin he is on but he states he is only taking a few units (Novolog? Humalog?) at mealtimes based on pre-meal sugars, doesn't sound like he is taking a long-acting insulin. A1C isn't too bad.  Hypothyroid: TSH abn w/ recent hospitalization, pt reports taking Synthroid 100 mcg.   MUSCULOSKELETAL/RHEUM See above Neuro section  SKIN Nonhealing venous stasis ulcers: need to get him back into wound care, home health was providing wound care but as noted above this may not be available much longer.   Past medical history, surgical history, social history  and family history reviewed.  Patient Active Problem List   Diagnosis Date Noted  . Hypogonadism 04/15/2017  . Atypical atrial flutter ([place]) 02/22/2017  . Ischemic cardiomyopathy 02/22/2017  . S/P coronary artery stent placement 02/22/2017  . Acute bilateral deep vein thrombosis (DVT) of femoral veins ([place]) 02/21/2017  . Acute renal  failure with acute cortical necrosis ([place]) 02/21/2017  . Chronic diarrhea 02/17/2017  . Debility 02/17/2017  . Depression, recurrent ([place]) 02/10/2017  . Other chronic pain 02/10/2017  . History of pulmonary embolism 02/10/2017  . Acquired hypothyroidism 02/10/2017  . Uncontrolled type 2 diabetes mellitus with complication, with long-term current use of insulin ([place]) 02/10/2017  . Drug-seeking behavior 04/19/2016  . Cellulitis 04/18/2016  . Bilateral diabetic foot ulcer associated with secondary diabetes mellitus ([place]) 01/25/2016  . Edema extremities 12/07/2015  . QT prolongation 12/07/2015  . MDD (major depressive disorder), recurrent severe, without psychosis ([place]) 11/19/2015  . Dehiscence of surgical wound 11/09/2015  . Delayed surgical wound healing 11/03/2015  . Secondary lymphedema 11/03/2015  . Acute on chronic systolic heart failure ([place]) 10/23/2015  . Volume overload 10/23/2015  . S/P right knee surgery 10/19/2015  . Chronic venous insufficiency 12/23/2014  . PVD (peripheral vascular disease) ([place]) 12/23/2014  . Onychomycosis due to dermatophyte 09/12/2013  . Enthesopathy of foot 09/12/2013  . Keratosis 09/12/2013  . Depression, major 09/03/2013  . Generalized anxiety disorder 09/01/2013  . Bilateral leg pain 07/22/2013  . DVT, femoral, chronic ([place]) 07/10/2013  . Stasis dermatitis of both legs 07/10/2013  . Lumbar stenosis 09/21/2012  . Spinal stenosis of lumbar region 09/21/2012  . CAD (coronary artery disease) 09/16/2012  . Essential hypertension 11/21/2011  . Obstructive sleep apnea syndrome in adult 11/21/2011  . Pulmonary embolism ([place]) 11/21/2011  . Heterozygous factor V Leiden mutation ([place]) 11/17/2011  . Lymphedema of lower extremity 10/19/2011  . Atherosclerosis of native coronary artery of native heart without angina pectoris 02/22/2011  . Dilated cardiomyopathy ([place]) 02/22/2011  . Mixed diabetic hyperlipidemia associated with type 2 diabetes mellitus ([place])  02/22/2011  . Morbid obesity with BMI of 50.0-59.9, adult ([place]) 07/01/2009    Current medication list and allergy/intolerance information reviewed.   Current Outpatient Prescriptions on File Prior to Visit  Medication Sig Dispense Refill  . aspirin EC 81 MG tablet Take 81 mg by mouth daily.    . baclofen (LIORESAL) 10 MG tablet Take 1 tablet (10 mg total) by mouth 4 (four) times daily. 60 tablet 0  . blood glucose meter kit and supplies KIT Dispense based on patient and insurance preference. Use up to four times daily as directed. Please include lancets, test strips, control solution. 1 each 99  . clonazePAM (KLONOPIN) 0.5 MG tablet Take 1 tablet (0.5 mg total) by mouth 2 (two) times daily as needed for anxiety. Use sparingly to prevent tolerance/dependence, will use for one month while transitioning to antidepressant/antianxiety 30 tablet 0  . DULoxetine (CYMBALTA) 30 MG capsule Take 1 capsule (30 mg total) by mouth daily. 30 capsule 1  . glucose blood test strip Use up to 4 times per day as directed with glucometer. Disp: 100. Refill x99 100 each 99  . insulin degludec (TRESIBA) 100 UNIT/ML SOPN FlexTouch Pen Inject 0.15 mLs (15 Units total) into the skin daily. Increase daily Insulin dose by 3 units at a time, twice per week, until fasting sugars are consistently 80-130, then continue at that dose    . Lancet Device MISC Use up to 4 times per day as  directed with glucometer. Disp: 100. Refill x99 100 each 99  . levothyroxine (SYNTHROID, LEVOTHROID) 100 MCG tablet Take 1 tablet (100 mcg total) by mouth daily. 90 tablet 3  . naproxen (NAPROSYN) 500 MG tablet Take 1 tablet (500 mg total) by mouth 2 (two) times daily with a meal. 30 tablet 0   No current facility-administered medications on file prior to visit.    No Known Allergies    Review of Systems:  Constitutional: No fever/chills  HEENT: No  headache, no vision change  Cardiac: No  chest pain, No  pressure, No  palpitations  Respiratory:  +chronic debility and anxiety causing shortness of breath. No  Cough  Gastrointestinal: No  abdominal pain  Musculoskeletal: No new myalgia/arthralgia, +chronic debilitating back pain   Skin: +ulceration and skin breakdown as per HPI   Neurologic: +generalized weakness, No  Dizziness  Psychiatric: +concerns with depression, +concerns with anxiety  Exam:  BP (!) 147/81   Pulse 98   Ht '5\' 10"'  (1.778 m)   Constitutional: VS see above. General Appearance: alert, obese, disheveled appearance but fair hygiene, NAD  Eyes: Normal lids and conjunctive, non-icteric sclera  Ears, Nose, Mouth, Throat: MMM, Normal external inspection ears/nares/mouth/lips/gums.  Neck: No masses, trachea midline.   Respiratory: Normal respiratory effort. no wheeze, no rhonchi, no rales  Cardiovascular: S1/S2 normal if faint, no murmur, no rub/gallop auscultated. RRR.   Musculoskeletal: Gait not examined - pt in wheelchair. Able to move all extremities independently  Neurological: Normal balance/coordination. No tremor.  Skin: =venous stasis dermatitis LE bilaterally, open wound on R tibial aspect, draining serous fluid, granulation tissue present    Psychiatric: Fair judgment/insight. Depressed mood and affect. Oriented x3. No SI. No thought disorder.     Results for orders placed or performed in visit on 04/13/17 (from the past 72 hour(s))  POCT HgB A1C     Status: None   Collection Time: 04/13/17 10:21 AM  Result Value Ref Range   Hemoglobin A1C 9.9   POCT Urinalysis Dipstick     Status: None   Collection Time: 04/13/17 10:22 AM  Result Value Ref Range   Color, UA YELLOW    Clarity, UA CLEAR    Glucose, UA NEGATIVE    Bilirubin, UA NEGATIVE    Ketones, UA NEGATIVE    Spec Grav, UA 1.010 1.010 - 1.025   Blood, UA NEGATIVE    pH, UA 6.0 5.0 - 8.0   Protein, UA NEGATIVE    Urobilinogen, UA 0.2 0.2 or 1.0 E.U./dL   Nitrite, UA NEGATIVE    Leukocytes, UA Negative  Negative  CBC with Differential/Platelet     Status: Abnormal   Collection Time: 04/13/17 10:28 AM  Result Value Ref Range   WBC 8.7 3.8 - 10.8 K/uL   RBC 5.56 4.20 - 5.80 MIL/uL   Hemoglobin 15.8 13.2 - 17.1 g/dL   HCT 48.2 38.5 - 50.0 %   MCV 86.7 80.0 - 100.0 fL   MCH 28.4 27.0 - 33.0 pg   MCHC 32.8 32.0 - 36.0 g/dL   RDW 15.7 (H) 11.0 - 15.0 %   Platelets 302 140 - 400 K/uL   MPV 11.6 7.5 - 12.5 fL   Neutro Abs 5,829 1,500 - 7,800 cells/uL   Lymphs Abs 1,827 850 - 3,900 cells/uL   Monocytes Absolute 696 200 - 950 cells/uL   Eosinophils Absolute 261 15 - 500 cells/uL   Basophils Absolute 87 0 - 200 cells/uL   Neutrophils Relative % 67 %  Lymphocytes Relative 21 %   Monocytes Relative 8 %   Eosinophils Relative 3 %   Basophils Relative 1 %   Smear Review Criteria for review not met   COMPLETE METABOLIC PANEL WITH GFR     Status: Abnormal   Collection Time: 04/13/17 10:28 AM  Result Value Ref Range   Sodium 142 135 - 146 mmol/L   Potassium 4.3 3.5 - 5.3 mmol/L   Chloride 96 (L) 98 - 110 mmol/L   CO2 26 20 - 31 mmol/L   Glucose, Bld 255 (H) 65 - 99 mg/dL   BUN 28 (H) 7 - 25 mg/dL   Creat 1.25 0.70 - 1.33 mg/dL    Comment:   For patients > or = 52 years of age: The upper reference limit for Creatinine is approximately 13% higher for people identified as African-American.      Total Bilirubin 1.2 0.2 - 1.2 mg/dL   Alkaline Phosphatase 95 40 - 115 U/L   AST 15 10 - 35 U/L   ALT 10 9 - 46 U/L   Total Protein 6.8 6.1 - 8.1 g/dL   Albumin 4.0 3.6 - 5.1 g/dL   Calcium 9.6 8.6 - 10.3 mg/dL   Globulin 2.8 1.9 - 3.7 g/dL   AG Ratio 1.4 1.0 - 2.5 Ratio   BUN/Creatinine Ratio 22.4 (H) 6 - 22 Ratio   GFR, Est African American 77 >=60 mL/min   GFR, Est Non African American 66 >=60 mL/min  TSH     Status: Abnormal   Collection Time: 04/13/17 10:28 AM  Result Value Ref Range   TSH 7.51 (H) 0.40 - 4.50 mIU/L     GAD 7 : Generalized Anxiety Score 02/08/2017  Nervous,  Anxious, on Edge 3  Control/stop worrying 3  Worry too much - different things 3  Trouble relaxing 1  Restless 3  Easily annoyed or irritable 3  Afraid - awful might happen 3  Total GAD 7 Score 19    Depression screen PHQ 2/9 02/08/2017  Decreased Interest 3  Down, Depressed, Hopeless 3  PHQ - 2 Score 6  Altered sleeping 3  Tired, decreased energy 3  Change in appetite 3  Feeling bad or failure about yourself  3  Trouble concentrating 2  Moving slowly or fidgety/restless 1  Suicidal thoughts 2  PHQ-9 Score 23      ASSESSMENT/PLAN: The primary encounter diagnosis was Chronic pain syndrome. Diagnoses of Severe episode of recurrent major depressive disorder, without psychotic features ([place]), Generalized anxiety disorder, Uncontrolled type 2 diabetes mellitus with other circulatory complication, with long-term current use of insulin ([place]), Hypothyroidism, unspecified type, Venous insufficiency of both lower extremities, Other chronic pain, Recurrent UTI, PVD (peripheral vascular disease) ([place]), Ischemic cardiomyopathy, Essential hypertension, Chronic deep vein thrombosis (DVT) of femoral vein, unspecified laterality ([place]), Dilated cardiomyopathy ([place]), Chronic venous insufficiency, Coronary artery disease involving native heart, angina presence unspecified, unspecified vessel or lesion type, Atypical atrial flutter ([place]), Obstructive sleep apnea syndrome in adult, Uncontrolled type 2 diabetes mellitus with complication, with long-term current use of insulin ([place]), Heterozygous factor V Leiden mutation ([place]), Debility, and MDD (major depressive disorder), recurrent severe, without psychosis ([place]) were also pertinent to this visit.   Need accurate med list - pt to bring pill bottles for review next visit  CV: fluid status and renal function stable at the moment, BP ok, tolerating Xarelto. He needs follow-up with cardiology, no chest pain or clinical HF at the moment but still significant  issues  from venous stasis - BP control and DM control stressed  RESP: cont CPAP, await PFT   NEURO: Pain control will be a challenge - pt is adamant about not going into assisted living, however he is clearly unable to care for himself at home and hygiene is at a dangerous point per home health's report. He is adamant about not wanting more injections or surgeries if this can be avoided. I think we can try opiate medications. I do not suspect that the patient is likely to divert these medicines. While he has expressed suicidal thoughts in the past I believe this was more passive death wish given the severity of his pain, he has never appeared confused to me and I doubt he is at high risk of overdose. I have built a relationship with this patient to the point that I tfeel comfortable managing pain medications if he can bring his pill bottles to next visit and demonstrate clear knowledge of his medication regimen.   DM2: A1C isn't too bad but he is unclear about his insulin regimen. Would prefer to simplify regimen with once daily long-acting agent if we can control Glc with this.   Hypothyroid: recheck TSH   SKIN: need to get him back into wound care  Follow-up plan: Return in about 1 week (around 04/20/2017) for review medication plan and recheck on pain meds .   Patient Instructions  Plan: 1. Starting pain medication with Oxycodone-Acetaminophen with plans to titrate this up safely to control pain 2. Refilled Cymbalta 3. Savings coupon for Xarelto 4. Need to get set up with wound care 5. I will call home health and figure out what's going on  6. BRING ALL MEDICATIONS with you to next visit - ignore all printed medications lists for now 7. Will come up with insulin plan once I know what you're taking and once we know whether Robby Sermon is expired or not 8. Labs today     Visit summary with medication list and pertinent instructions was printed for patient to review, alert [place] if any changes  needed. All questions at time of visit were answered - patient instructed to contact office with any additional concerns. ER/RTC precautions were reviewed with the patient and understanding verbalized.   Note: Total time spent 40 minutes, greater than 50% of the visit was spent face-to-face counseling and coordinating care for the above diagnoses noted in assessment

## 2017-04-13 NOTE — Telephone Encounter (Signed)
Demetria from [facility] called stated that they are not able to assist patient at this time because she feels like he needs to be in assisted living. She stated that patient lives by himself and is unable to care for himself. On several occassions she stated that patient had stool all over his living space and she cannot continue to send physical therapy in that condition. She also feels that patient can benefit from being on anxiety medication. . Patient has office visit today so that concern will be addressed by PCP as she is now aware of the situation. Keslyn Teater,CMA

## 2017-04-13 NOTE — Patient Instructions (Signed)
Plan: 1. Starting pain medication with Oxycodone-Acetaminophen with plans to titrate this up safely to control pain 2. Refilled Cymbalta 3. Savings coupon for Xarelto 4. Need to get set up with wound care 5. I will call home health and figure out what's going on  6. BRING ALL MEDICATIONS with you to next visit - ignore all printed medications lists for now 7. Will come up with insulin plan once I know what you're taking and once we know whether Robby Sermon is expired or not 8. Labs today

## 2017-04-13 NOTE — Telephone Encounter (Signed)
See office progress note

## 2017-04-14 MED ORDER — LEVOTHYROXINE SODIUM 112 MCG PO TABS: 112.0000 ug | ORAL_TABLET | Freq: Every day | ORAL | 0 refills | Status: DC

## 2017-04-15 DIAGNOSIS — IMO0002 Reserved for concepts with insufficient information to code with codable children: Secondary | ICD-10-CM | POA: Insufficient documentation

## 2017-04-20 ENCOUNTER — Encounter: Payer: Self-pay | Admitting: Osteopathic Medicine

## 2017-04-20 ENCOUNTER — Ambulatory Visit (INDEPENDENT_AMBULATORY_CARE_PROVIDER_SITE_OTHER): Payer: Medicare Other | Admitting: Osteopathic Medicine

## 2017-04-20 VITALS — BP 130/84 | HR 100 | Ht 70.0 in | Wt 348.0 lb

## 2017-04-20 DIAGNOSIS — F411 Generalized anxiety disorder: Secondary | ICD-10-CM

## 2017-04-20 DIAGNOSIS — G894 Chronic pain syndrome: Secondary | ICD-10-CM

## 2017-04-20 DIAGNOSIS — I739 Peripheral vascular disease, unspecified: Secondary | ICD-10-CM | POA: Diagnosis not present

## 2017-04-20 DIAGNOSIS — R5381 Other malaise: Secondary | ICD-10-CM

## 2017-04-20 DIAGNOSIS — E1165 Type 2 diabetes mellitus with hyperglycemia: Secondary | ICD-10-CM

## 2017-04-20 DIAGNOSIS — Z794 Long term (current) use of insulin: Secondary | ICD-10-CM | POA: Diagnosis not present

## 2017-04-20 DIAGNOSIS — IMO0002 Reserved for concepts with insufficient information to code with codable children: Secondary | ICD-10-CM

## 2017-04-20 DIAGNOSIS — E1159 Type 2 diabetes mellitus with other circulatory complications: Secondary | ICD-10-CM | POA: Diagnosis not present

## 2017-04-20 DIAGNOSIS — D6851 Activated protein C resistance: Secondary | ICD-10-CM

## 2017-04-20 DIAGNOSIS — I255 Ischemic cardiomyopathy: Secondary | ICD-10-CM

## 2017-04-20 DIAGNOSIS — N390 Urinary tract infection, site not specified: Secondary | ICD-10-CM | POA: Diagnosis not present

## 2017-04-20 DIAGNOSIS — I420 Dilated cardiomyopathy: Secondary | ICD-10-CM

## 2017-04-20 MED ORDER — INSULIN GLARGINE 300 UNIT/ML ~~LOC~~ SOPN: 30.0000 [IU] | PEN_INJECTOR | Freq: Every day | SUBCUTANEOUS | 6 refills | Status: DC

## 2017-04-20 MED ORDER — DULOXETINE HCL 60 MG PO CPEP: 60.0000 mg | ORAL_CAPSULE | Freq: Every day | ORAL | 1 refills | Status: DC

## 2017-04-20 MED ORDER — OXYCODONE-ACETAMINOPHEN 10-325 MG PO TABS: 1.0000 | ORAL_TABLET | Freq: Four times a day (QID) | ORAL | 0 refills | Status: DC | PRN

## 2017-04-20 MED ORDER — INSULIN PEN NEEDLE 29G X 12MM MISC: 99 refills | Status: AC

## 2017-04-20 NOTE — Progress Notes (Signed)
HPI: Mark Dean is a 51 y.o. male  who presents to [facility] today, 04/20/17,  for chief complaint of:  Chief Complaint  Patient presents with  . Follow-up    PAIN MEDICATION IS NOT WORKING    CARDIOVASCULAR -  Recent hospitalization and multiple cardiac complications - no CP/SOB or orthopnea at this time and LE swelling is improved, he is weighing himself at home. He is following with cardio outpatient but had to reschedule recent appt - unable to leave the house d/t pain. Recent cardiology notes reviewed, 03/21/2017. Medications confirmed today - pt is taking cardio meds as below  Dilated CM:  - Echo 11/2015: LVEF 30-35%. RV mildly reduced. LVIDd 6.8 cm.  - Echo 02/2017: LVEF <25%. RV mod dilated. 1+ MR. 2-3+ TR - torsemide 40 mg has improved fluid status, ok to continue as long as renal indices stable - Continue digoxin, carvedilol and hydralazine. Titrate hydralazine to 25 mg twice daily... consider addition of ACe-i/ARB if renal indices remained stable. Atrial tach versus flutter DCCV 02/2017 Cardio plans to continue amiodarone.  DVT & Hx PE with factor V Leiden mutation Continue Xarelto. CAD LHC 12/17/15: LAD with 50% mid and 95% mid lesion s/p 3.5x15 mm Vision BMS to 0% residual. PDA with 60% osteal stenosis otherwise normal.  RESPIRATORY  SOB with anxiety - anxiety is improving since last visit for this patient. He is convinced that medications will not help. No orthopnea. PFTs with DLCO have been ordered by cardiology  OSA w/ CPAP  NEUROLOGICAL/PSYCHIATRIC Chronic pain and LE weakness interferes with ADL. Patient has either developed tolerance to or is convinced that opiates have not been helpful - he reports that the pain is only slightly better with the medications but he is able to get around a little bit better and is not feeling so anxious/depressed. Other therapies such as Cymbalta have been added, pt unable to tolerate  gabapentin/Lyrica in the past.  Anxiety/Depression: significant comorbidity. Pt is much calmer today than at previous visits where he was stating pain was so bad he wanted to die. Has not expressed suicidal plan. Has been resistant to psychiatric intervention/referral  RENAL Watching renal fxn closely on new medication regimen. Recent BMP ok.   ENDOCRINE DM2: recently back on insulin - sliding scale at mealtimes only based on recent discharge from rehab, no long acting insulin. He is taking 2-6 units at mealtimes. His fasting sugars are typically in he mid/high-200s and occasionally into the 300s.  A1C isn't too bad but he has also been in hospital and rehab w/ controlled diet and is on an insulin regimen which does not correspond to standard of care Hypothyroid: TSH abn w/ recent hospitalization, pt reports taking Synthroid 100 mcg. We recently increased it based on updated TSH .  MUSCULOSKELETAL/RHEUM See above Neuro section  SKIN Nonhealing venous stasis ulcers: need to get him back into wound care, home health was providing wound care but he has fired home health.  INFECTIOUS DISEASE UTI - recent treatment with Keflex for UTI from ER but pt states that he later got a call that the abx needed switched but he can't recall which abx he is taking now. Ucx reviewed and grew very rare pathogen: Raoultella planticola - resistant to PCN/ceph. Called pharmacy and confirmed that he picked up Cipro 04/16/17. No dysuria/frequency     Past medical history, surgical history, social history and family history reviewed.  Patient Active Problem List   Diagnosis Date Noted  .  Hypogonadism 04/15/2017  . Atypical atrial flutter ([place]) 02/22/2017  . Ischemic cardiomyopathy 02/22/2017  . S/P coronary artery stent placement 02/22/2017  . Acute bilateral deep vein thrombosis (DVT) of femoral veins ([place]) 02/21/2017  . Acute renal failure with acute cortical necrosis ([place]) 02/21/2017  . Chronic diarrhea  02/17/2017  . Debility 02/17/2017  . Depression, recurrent ([place]) 02/10/2017  . Other chronic pain 02/10/2017  . History of pulmonary embolism 02/10/2017  . Acquired hypothyroidism 02/10/2017  . Uncontrolled type 2 diabetes mellitus with complication, with long-term current use of insulin ([place]) 02/10/2017  . Drug-seeking behavior 04/19/2016  . Cellulitis 04/18/2016  . Bilateral diabetic foot ulcer associated with secondary diabetes mellitus ([place]) 01/25/2016  . Edema extremities 12/07/2015  . QT prolongation 12/07/2015  . MDD (major depressive disorder), recurrent severe, without psychosis ([place]) 11/19/2015  . Dehiscence of surgical wound 11/09/2015  . Delayed surgical wound healing 11/03/2015  . Secondary lymphedema 11/03/2015  . Acute on chronic systolic heart failure ([place]) 10/23/2015  . Volume overload 10/23/2015  . S/P right knee surgery 10/19/2015  . Chronic venous insufficiency 12/23/2014  . PVD (peripheral vascular disease) ([place]) 12/23/2014  . Onychomycosis due to dermatophyte 09/12/2013  . Enthesopathy of foot 09/12/2013  . Keratosis 09/12/2013  . Depression, major 09/03/2013  . Generalized anxiety disorder 09/01/2013  . Bilateral leg pain 07/22/2013  . DVT, femoral, chronic ([place]) 07/10/2013  . Stasis dermatitis of both legs 07/10/2013  . Lumbar stenosis 09/21/2012  . Spinal stenosis of lumbar region 09/21/2012  . CAD (coronary artery disease) 09/16/2012  . Essential hypertension 11/21/2011  . Obstructive sleep apnea syndrome in adult 11/21/2011  . Pulmonary embolism ([place]) 11/21/2011  . Heterozygous factor V Leiden mutation ([place]) 11/17/2011  . Lymphedema of lower extremity 10/19/2011  . Atherosclerosis of native coronary artery of native heart without angina pectoris 02/22/2011  . Dilated cardiomyopathy ([place]) 02/22/2011  . Mixed diabetic hyperlipidemia associated with type 2 diabetes mellitus ([place]) 02/22/2011  . Morbid obesity with BMI of 50.0-59.9, adult ([place]) 07/01/2009     Current medication list and allergy/intolerance information reviewed.   Current Outpatient Prescriptions on File Prior to Visit  Medication Sig Dispense Refill  . amiodarone (PACERONE) 200 MG tablet Take 200 mg by mouth daily.    Marland Kitchen aspirin EC 81 MG tablet Take 81 mg by mouth daily.    . baclofen (LIORESAL) 10 MG tablet Take 1 tablet (10 mg total) by mouth 4 (four) times daily. 60 tablet 0  . blood glucose meter kit and supplies KIT Dispense based on patient and insurance preference. Use up to four times daily as directed. Please include lancets, test strips, control solution. 1 each 99  . carvedilol (COREG) 25 MG tablet Take 25 mg by mouth 2 (two) times daily.    . cephALEXin (KEFLEX) 500 MG capsule Take 500 mg by mouth 2 (two) times daily.    . clonazePAM (KLONOPIN) 0.5 MG tablet Take 1 tablet (0.5 mg total) by mouth 2 (two) times daily as needed for anxiety. Use sparingly to prevent tolerance/dependence, will use for one month while transitioning to antidepressant/antianxiety 30 tablet 0  . digoxin (LANOXIN) 0.125 MG tablet Take 125 mcg by mouth every other day.    . DULoxetine (CYMBALTA) 30 MG capsule Take 1 capsule (30 mg total) by mouth daily. (Patient not taking: Reported on 04/15/2017) 30 capsule 1  . DULoxetine (CYMBALTA) 60 MG capsule Take 1 capsule (60 mg total) by mouth 2 (two) times daily. 60 capsule 3  . glucose  blood test strip Use up to 4 times per day as directed with glucometer. Disp: 100. Refill x99 100 each 99  . hydrALAZINE (APRESOLINE) 25 MG tablet Take 25 mg by mouth 2 (two) times daily.    . insulin degludec (TRESIBA) 100 UNIT/ML SOPN FlexTouch Pen Inject 0.15 mLs (15 Units total) into the skin daily. Increase daily Insulin dose by 3 units at a time, twice per week, until fasting sugars are consistently 80-130, then continue at that dose (Patient not taking: Reported on 04/15/2017)    . Lancet Device MISC Use up to 4 times per day as directed with glucometer. Disp: 100.  Refill x99 100 each 99  . levothyroxine (SYNTHROID, LEVOTHROID) 112 MCG tablet Take 1 tablet (112 mcg total) by mouth daily before breakfast. Plan to repeat labs approx 06/07/17 60 tablet 0  . naproxen (NAPROSYN) 500 MG tablet Take 1 tablet (500 mg total) by mouth 2 (two) times daily with a meal. 30 tablet 0  . oxyCODONE-acetaminophen (PERCOCET) 10-325 MG tablet Take 1 tablet by mouth every 8 (eight) hours as needed for pain. 45 tablet 0  . potassium chloride SA (K-DUR,KLOR-CON) 20 MEQ tablet Take 20 mEq by mouth daily.    . rivaroxaban (XARELTO) 20 MG TABS tablet Take 20 mg by mouth 2 (two) times daily.    Marland Kitchen torsemide (DEMADEX) 20 MG tablet Take 40 mg by mouth daily.     No current facility-administered medications on file prior to visit.    No Known Allergies    Review of Systems:  HEENT: No  headache, no vision change  Cardiac: No  chest pain, No  pressure, No palpitations  Respiratory:  +chronic debility and anxiety causing shortness of breath. No  Cough  Gastrointestinal: No  abdominal pain  Musculoskeletal: No new myalgia/arthralgia, +chronic debilitating back pain   Skin: +ulceration and skin breakdown as per HPI   Neurologic: +generalized weakness, No  Dizziness  Psychiatric: +concerns with depression, +concerns with anxiety  Exam:  BP 130/84   Pulse 100   Ht '5\' 10"'  (1.778 m)   Wt (!) 348 lb (157.9 kg)   BMI 49.93 kg/m   Constitutional: VS see above. General Appearance: alert, obese, disheveled appearance but fair hygiene, NAD  Eyes: Normal lids and conjunctive, non-icteric sclera  Ears, Nose, Mouth, Throat: MMM, Normal external inspection ears/nares/mouth/lips/gums.  Neck: No masses, trachea midline.   Respiratory: Normal respiratory effort. no wheeze, no rhonchi, no rales  Cardiovascular: S1/S2 normal if faint, no murmur, no rub/gallop auscultated. RRR.   Musculoskeletal: Gait not examined - pt in wheelchair. Able to move all extremities  independently  Neurological: Normal balance/coordination. No tremor.  Skin: =venous stasis dermatitis LE bilaterally, open wound on R tibial aspect, draining serous fluid, granulation tissue present    Psychiatric: Fair judgment/insight. Depressed mood and affect. Oriented x3. No SI. No thought disorder.     ASSESSMENT/PLAN: The primary encounter diagnosis was Uncontrolled type 2 diabetes mellitus with other circulatory complication, with long-term current use of insulin ([place]). Diagnoses of Chronic pain syndrome, Generalized anxiety disorder, Debility, Heterozygous factor V Leiden mutation ([place]), PVD (peripheral vascular disease) ([place]), Dilated cardiomyopathy ([place]), and Urinary tract infection without hematuria, site unspecified were also pertinent to this visit.  CV: fluid status and renal function stable at the moment, BP ok, tolerating Xarelto. He needs follow-up with cardiology, no chest pain or clinical HF at the moment but still significant issues from venous stasis - BP control and DM control stressed  RESP: cont  CPAP, await PFT   NEURO: Pain control - we are increasing frequency of oxycodone. We are increasing dose of Cymbalta. I have built a relationship with this patient to the point that I tfeel comfortable managing pain medications - he brought pill bottles and demonstrates appropriate knowledge of his medication regimen.   DM2: A1C isn't too bad but he has been in a controlled environment (hospital, rehab) and on an insulin regiment that I believe is inappropriate. Calculated long-acting insulin by weigh touts him at a very high dose, will prefer to start lower and increase based on fasting Glc, pt advised this will take time, cautioned on hypoglycemic symptoms and RTC precautions.  Hypothyroid: recheck TSH 6-8 weeks  SKIN: need to get him back into wound care  ID: unusual UTI pathogen, recent UA negative but no culture collected, pt is significantly debilitated and multiple  comorbid conditions to give concern for immune compromise. Will recheck urine culture after completed Cipro course/   Follow-up plan: Return in about 2 weeks (around 05/04/2017) for recheck pain and sugars - sooner if needed .   Patient Instructions  Plan: 1. Increase the Oxycodone to every 6 hours (4 times per day) and let's follow-up in 2 weeks to see how you're doing on this dose  2. Add fiber to your diet to prevent severe constipation 3. Find out how much your medications will cost - be using the [employer], and let's see if we can move things along with [facility] to help with medication coverage  4. Recheck thyroid 6-8 weeks on new dose of medication (112 mcg daily, NOT 100 mcg dose).  5. Starting long-acting insulin - see below for full instructions on its use  6. We will work on alternative home health company which may be able to help with some cleaning services as well as wound care and physical therapy  7. We are increasing the dose of Cymbalta to 60 mg daily - you can use up the 30 mg tablets you have now by doubling these and then pick up the new prescription when you're out.   For Insulin  Start at 30 units per day - typically recommend taking this in the evening  Measure fasting blood sugar every day: goal for now is to get this to 80-130  Increase daily Insulin dose step-wise: increase by 5 units at a time, every 3 days OR twice per week (whichever you will remember easiest), until fasting sugars are consistently 80-130, then continue at that dose. Decrease dose of insulin by 3 units if your sugars drop below 80.   Plan to recheck A1C in 3 months  Plan to follow-up in the office sooner if you experience low sugars   Plan to follow-up in the office sooner if your fasting sugars are consistently high despite increasing insulin dose  Bring all sugar readings with you to your office visits      Visit summary with medication list and pertinent instructions was printed  for patient to review, alert [place] if any changes needed. All questions at time of visit were answered - patient instructed to contact office with any additional concerns. ER/RTC precautions were reviewed with the patient and understanding verbalized.   Note: Total time spent 40 minutes, greater than 50% of the visit was spent face-to-face counseling and coordinating care for the above diagnoses noted in assessment

## 2017-04-20 NOTE — Patient Instructions (Addendum)
Plan: 1. Increase the Oxycodone to every 6 hours (4 times per day) and let's follow-up in 2 weeks to see how you're doing on this dose  2. Add fiber to your diet to prevent severe constipation 3. Find out how much your medications will cost - be using the [employer], and let's see if we can move things along with [facility] to help with medication coverage  4. Recheck thyroid 6-8 weeks on new dose of medication (112 mcg daily, NOT 100 mcg dose).  5. Starting long-acting insulin - see below for full instructions on its use  6. We will work on alternative home health company which may be able to help with some cleaning services as well as wound care and physical therapy  7. We are increasing the dose of Cymbalta to 60 mg daily - you can use up the 30 mg tablets you have now by doubling these and then pick up the new prescription when you're out.   For Insulin  Start at 30 units per day - typically recommend taking this in the evening  Measure fasting blood sugar every day: goal for now is to get this to 80-130  Increase daily Insulin dose step-wise: increase by 5 units at a time, every 3 days OR twice per week (whichever you will remember easiest), until fasting sugars are consistently 80-130, then continue at that dose. Decrease dose of insulin by 3 units if your sugars drop below 80.   Plan to recheck A1C in 3 months  Plan to follow-up in the office sooner if you experience low sugars   Plan to follow-up in the office sooner if your fasting sugars are consistently high despite increasing insulin dose  Bring all sugar readings with you to your office visits

## 2017-04-25 ENCOUNTER — Telehealth: Payer: Self-pay | Admitting: Osteopathic Medicine

## 2017-04-25 NOTE — Telephone Encounter (Signed)
Pt called after hours clinic stating his pain is "very bad" in his legs and back. Pt states he has been taking the oxycodone (one tab q6hrs) and 60mg  of Cymbalta- no relief. Pt states he doesn't even notice the increased frequency/dosage. Will route to PCP for any recommendations. Pt does have appt next week for follow up.

## 2017-04-25 NOTE — Telephone Encounter (Signed)
Can increase to every 4 hours, be sure to keep follow-up appointment next week

## 2017-04-25 NOTE — Telephone Encounter (Signed)
Pt advised.

## 2017-04-27 ENCOUNTER — Telehealth: Payer: Self-pay

## 2017-04-27 NOTE — Telephone Encounter (Signed)
Patient called crying stating that he is in so much pain and he cannot stand. He says that he cant function and he cant get out to go pick up the medication that was sent to pharmacy for him. He stated that he does not know what to do at this point and he says PLEASE HELP HIM. Please advise. Carena Stream,CMA

## 2017-04-28 NOTE — Telephone Encounter (Signed)
Patient advised. [place] Phifer,CMA  

## 2017-04-28 NOTE — Telephone Encounter (Signed)
If he is unable to function safely on his own with our current plan, he needs to be seen for evaluation (call 911 or make it to the office) as there is nothing else I can do for him in his current situation. FYI he has fired home health.   If he is able to be seen at the office, make sure schedulers are aware he will likely need extended appt time especially if a different provider sees him. I've got my cell phone on me if any que stions arise.

## 2017-05-04 ENCOUNTER — Ambulatory Visit (INDEPENDENT_AMBULATORY_CARE_PROVIDER_SITE_OTHER): Payer: Medicare Other | Admitting: Osteopathic Medicine

## 2017-05-04 VITALS — BP 113/72 | HR 86

## 2017-05-04 DIAGNOSIS — I872 Venous insufficiency (chronic) (peripheral): Secondary | ICD-10-CM

## 2017-05-04 DIAGNOSIS — I255 Ischemic cardiomyopathy: Secondary | ICD-10-CM | POA: Diagnosis not present

## 2017-05-04 DIAGNOSIS — I739 Peripheral vascular disease, unspecified: Secondary | ICD-10-CM | POA: Diagnosis not present

## 2017-05-04 DIAGNOSIS — I420 Dilated cardiomyopathy: Secondary | ICD-10-CM | POA: Diagnosis not present

## 2017-05-04 DIAGNOSIS — E1159 Type 2 diabetes mellitus with other circulatory complications: Secondary | ICD-10-CM

## 2017-05-04 DIAGNOSIS — D6851 Activated protein C resistance: Secondary | ICD-10-CM | POA: Diagnosis not present

## 2017-05-04 DIAGNOSIS — R5381 Other malaise: Secondary | ICD-10-CM | POA: Diagnosis not present

## 2017-05-04 DIAGNOSIS — G894 Chronic pain syndrome: Secondary | ICD-10-CM

## 2017-05-04 DIAGNOSIS — F339 Major depressive disorder, recurrent, unspecified: Secondary | ICD-10-CM

## 2017-05-04 DIAGNOSIS — Z794 Long term (current) use of insulin: Secondary | ICD-10-CM

## 2017-05-04 DIAGNOSIS — E1165 Type 2 diabetes mellitus with hyperglycemia: Secondary | ICD-10-CM

## 2017-05-04 DIAGNOSIS — IMO0002 Reserved for concepts with insufficient information to code with codable children: Secondary | ICD-10-CM

## 2017-05-04 MED ORDER — OXYCODONE-ACETAMINOPHEN 10-325 MG PO TABS: 1.0000 | ORAL_TABLET | Freq: Three times a day (TID) | ORAL | 0 refills | Status: DC | PRN

## 2017-05-04 MED ORDER — FENTANYL 25 MCG/HR TD PT72: 25.0000 ug | MEDICATED_PATCH | TRANSDERMAL | 0 refills | Status: DC

## 2017-05-04 MED ORDER — DULOXETINE HCL 60 MG PO CPEP: 60.0000 mg | ORAL_CAPSULE | Freq: Two times a day (BID) | ORAL | 1 refills | Status: DC

## 2017-05-04 MED ORDER — DOCUSATE SODIUM 100 MG PO CAPS: 100.0000 mg | ORAL_CAPSULE | Freq: Two times a day (BID) | ORAL | 1 refills | Status: AC

## 2017-05-04 MED ORDER — METFORMIN HCL 1000 MG PO TABS: 1000.0000 mg | ORAL_TABLET | Freq: Two times a day (BID) | ORAL | 1 refills | Status: AC

## 2017-05-04 MED ORDER — RIVAROXABAN 20 MG PO TABS: 20.0000 mg | ORAL_TABLET | Freq: Every day | ORAL | 1 refills | Status: DC

## 2017-05-04 NOTE — Patient Instructions (Addendum)
Plan: 1. Transition from Oxycodone tablets to Fentanyl Patches - can ONLY use oxycodone tablets every 8 hours for breakthrough pain. Will plan to see you in 1-2 weeks to recheck pain. If Fentanyl is not helping, we will need to have you see pain management or palliative medicine.  2. Conitnue increase Insulin 3. Double Torsemide dose for 3-5 days until swelling is better 4. Home health - wound care etc ASAP 5. Reach out to the [place] about prescription coverage  6. You are due for follow-up with your cardiologist, please schedule this ASAP for refills 7. We have added Docusate stool softener, continue with Metamucil and high-fiber diet

## 2017-05-04 NOTE — Progress Notes (Signed)
HPI: Mark Dean is a 51 y.o. male  who presents to [place] today, 05/04/17,  for chief complaint of:  Chief Complaint  Patient presents with  . Follow-up    MEDICATION AND STILL IN PAIN    DM2: Fasting Glc just under 200, up to 25 units Insulin daily.   Cardiac: weight up 10-15 lbs, has not increased diuretic, overdue for cardio f/u, didn't know about diuresis increase for weight gain...   Skin: has not seen wound care. Home health has stopped coming - there was some conflict about them not calling him appropriately, coming into the house and being judgmental of the condition of the house, see previous notes. No fever/chills, wounds were better but now weeping a bit, see above re: cardiac.   Back pain: limits ambulation, he has increased Oxycodone to q4h while awake but it's only helping minimally.   Constipation: worse on increased dose opiates, only taking Metamucil  Vascular: difficulty paying for Xarelto but is still taking it. Has not followed up with the [place] [facility] re: drug coverage.      Past medical history, surgical history, social history and family history reviewed.  Patient Active Problem List   Diagnosis Date Noted  . Hypogonadism 04/15/2017  . Atypical atrial flutter ([place]) 02/22/2017  . Ischemic cardiomyopathy 02/22/2017  . S/P coronary artery stent placement 02/22/2017  . Acute bilateral deep vein thrombosis (DVT) of femoral veins ([place]) 02/21/2017  . Acute renal failure with acute cortical necrosis ([place]) 02/21/2017  . Chronic diarrhea 02/17/2017  . Debility 02/17/2017  . Depression, recurrent ([place]) 02/10/2017  . Other chronic pain 02/10/2017  . History of pulmonary embolism 02/10/2017  . Acquired hypothyroidism 02/10/2017  . Uncontrolled type 2 diabetes mellitus with complication, with long-term current use of insulin ([place]) 02/10/2017  . Drug-seeking behavior 04/19/2016  . Cellulitis 04/18/2016  . Bilateral diabetic foot  ulcer associated with secondary diabetes mellitus ([place]) 01/25/2016  . Edema extremities 12/07/2015  . QT prolongation 12/07/2015  . MDD (major depressive disorder), recurrent severe, without psychosis ([place]) 11/19/2015  . Dehiscence of surgical wound 11/09/2015  . Delayed surgical wound healing 11/03/2015  . Secondary lymphedema 11/03/2015  . Acute on chronic systolic heart failure ([place]) 10/23/2015  . Volume overload 10/23/2015  . S/P right knee surgery 10/19/2015  . Chronic venous insufficiency 12/23/2014  . PVD (peripheral vascular disease) ([place]) 12/23/2014  . Onychomycosis due to dermatophyte 09/12/2013  . Enthesopathy of foot 09/12/2013  . Keratosis 09/12/2013  . Depression, major 09/03/2013  . Generalized anxiety disorder 09/01/2013  . Bilateral leg pain 07/22/2013  . DVT, femoral, chronic ([place]) 07/10/2013  . Stasis dermatitis of both legs 07/10/2013  . Lumbar stenosis 09/21/2012  . Spinal stenosis of lumbar region 09/21/2012  . CAD (coronary artery disease) 09/16/2012  . Essential hypertension 11/21/2011  . Obstructive sleep apnea syndrome in adult 11/21/2011  . Pulmonary embolism ([place]) 11/21/2011  . Heterozygous factor V Leiden mutation ([place]) 11/17/2011  . Lymphedema of lower extremity 10/19/2011  . Atherosclerosis of native coronary artery of native heart without angina pectoris 02/22/2011  . Dilated cardiomyopathy ([place]) 02/22/2011  . Mixed diabetic hyperlipidemia associated with type 2 diabetes mellitus ([place]) 02/22/2011  . Morbid obesity with BMI of 50.0-59.9, adult ([place]) 07/01/2009    Current medication list and allergy/intolerance information reviewed.   Current Outpatient Prescriptions on File Prior to Visit  Medication Sig Dispense Refill  . amiodarone (PACERONE) 200 MG tablet Take 200 mg by mouth daily.    Marland Kitchen  aspirin EC 81 MG tablet Take 81 mg by mouth daily.    . blood glucose meter kit and supplies KIT Dispense based on patient and insurance preference. Use up to  four times daily as directed. Please include lancets, test strips, control solution. 1 each 99  . carvedilol (COREG) 6.25 MG tablet Take 6.25 mg by mouth daily.    . digoxin (LANOXIN) 0.125 MG tablet Take 125 mcg by mouth every other day.    . DULoxetine (CYMBALTA) 60 MG capsule Take 1 capsule (60 mg total) by mouth daily. 90 capsule 1  . glucose blood test strip Use up to 4 times per day as directed with glucometer. Disp: 100. Refill x99 100 each 99  . hydrALAZINE (APRESOLINE) 25 MG tablet Take 25 mg by mouth 2 (two) times daily.    . Insulin Glargine (TOUJEO SOLOSTAR) 300 UNIT/ML SOPN Inject 30 Units into the skin daily. Increase by 3 units every 3 days to goal fasting blood sugar 80-130 3 mL 6  . Insulin Pen Needle 29G X 12MM MISC Use with Toujeo pen as directed 100 each 99  . Lancet Device MISC Use up to 4 times per day as directed with glucometer. Disp: 100. Refill x99 100 each 99  . levothyroxine (SYNTHROID, LEVOTHROID) 112 MCG tablet Take 1 tablet (112 mcg total) by mouth daily before breakfast. Plan to repeat labs approx 06/07/17 60 tablet 0  . metFORMIN (GLUCOPHAGE) 1000 MG tablet Take 1,000 mg by mouth 2 (two) times daily with a meal.    . oxyCODONE-acetaminophen (PERCOCET) 10-325 MG tablet Take 1 tablet by mouth every 6 (six) hours as needed for pain. 120 tablet 0  . potassium chloride SA (K-DUR,KLOR-CON) 20 MEQ tablet Take 20 mEq by mouth daily.    . rivaroxaban (XARELTO) 20 MG TABS tablet Take 20 mg by mouth 2 (two) times daily.    Marland Kitchen torsemide (DEMADEX) 20 MG tablet Take 40 mg by mouth daily.     No current facility-administered medications on file prior to visit.    No Known Allergies    Review of Systems:  Constitutional: +fatigue, pain  Cardiac: No  chest pain, No  pressure, No palpitations, +LE swelling and weight gain  Respiratory:  No  shortness of breath.   Gastrointestinal: No  abdominal pain, +constipation  Musculoskeletal: No new myalgia/arthralgia  Skin:  +wounds as per HPI  Psychiatric: +concerns with depression, No  concerns with anxiety  Exam:  BP 113/72   Pulse 86   Constitutional: VS see above. General Appearance: alert, disheveled but not filthy, obese, NAD  Eyes: Normal lids and conjunctive, non-icteric sclera  Neck: No masses, trachea midline.   Respiratory: Normal respiratory effort. no wheeze, no rhonchi, no rales  Cardiovascular: S1/S2 normal, no murmur, no rub/gallop auscultated. RRR. +2 LE edema   Musculoskeletal: Symmetric and independent movement of all extremities  Neurological: Normal balance/coordination. No tremor.  Skin: weeping venous stasis ulcers LE worse on L, no purulent drainage  Psychiatric: Normal judgment/insight. Cranky mood and affect. Oriented x3.      ASSESSMENT/PLAN: The primary encounter diagnosis was Chronic pain syndrome. Diagnoses of Uncontrolled type 2 diabetes mellitus with other circulatory complication, with long-term current use of insulin ([place]), Debility, Heterozygous factor V Leiden mutation ([place]), PVD (peripheral vascular disease) ([place]), Dilated cardiomyopathy ([place]), Venous insufficiency of both lower extremities, Chronic venous insufficiency, and Depression, recurrent ([place]) were also pertinent to this visit.  Was very clear with patient re: pain mgt plan - see pt instructions.  He is advised on sparing use of oxycodone for breakthrough pain on Fentanyl patches, which we are starting today at 25 mcg. He is advised that if he experiences sedation or falls or overdose, or I fI have other concerns, I will stop the medicine.   I believe he is suffering and I want to help, but he needs to work with me particularly with other medical issues - leg wounds and cardiac care in particular. Palliative care might be a good referral to think about.   He is instructed to obtain follow-up with cardiology ASAP d/t recent missed appt.   He is advised we will work to get him home health but he needs to  recognize that if the state of his house presents a health hazard then they may not be able to send folks out. He insists that assisted living is not consistent with his goals and he will never go, he wants to function on his own and get pain under control. I agree with pain control, but I will be honest about my assessments of his ability to care for himself. Will work with home health for now.   Morphine equivalents calculated using CDC table: Oxycodone 40-60 mg/ day = 60-90 morphine equivalents/ day, per UpToDate dosing info on Fentanyl TD: 25 mcg/72 h patch ok for patients currently on 60-134 morphine equivalents orally    Patient Instructions  Plan: 1. Transition from Oxycodone tablets to Fentanyl Patches - can ONLY use oxycodone tablets every 8 hours for breakthrough pain. Will plan to see you in 1-2 weeks to recheck pain. If Fentanyl is not helping, we will need to have you see pain management or palliative medicine.  2. Conitnue increase Insulin 3. Double Torsemide dose for 3-5 days until swelling is better 4. Home health - wound care etc ASAP 5. [facility]  6. You are due for follow-up with your cardiologist, please schedule this ASAP for refills 7. We have added Docusate stool softener, continue with Metamucil and high-fiber diet     Follow-up plan: Return for recheck pain and sugars 1-2 weeks .  Visit summary with medication list and pertinent instructions was printed for patient to review, alert us if any changes needed. All questions at time of visit were answered - patient instructed to contact office with any additional concerns. ER/RTC precautions were reviewed with the patient and understanding verbalized.   Note: Total time spent 40 minutes, greater than 50% of the visit was spent face-to-face counseling and coordinating care for the following: The primary encounter diagnosis was Chronic pain syndrome. Diagnoses of Uncontrolled type 2  diabetes mellitus with other circulatory complication, with long-term current use of insulin (HCC), Debility, Heterozygous factor V Leiden mutation (HCC), PVD (peripheral vascular disease) (HCC), Dilated cardiomyopathy (HCC), Venous insufficiency of both lower extremities, Chronic venous insufficiency, and Depression, recurrent (HCC) were also pertinent to this visit..  

## 2017-05-10 DIAGNOSIS — I82409 Acute embolism and thrombosis of unspecified deep veins of unspecified lower extremity: Secondary | ICD-10-CM | POA: Diagnosis not present

## 2017-05-10 DIAGNOSIS — Z86711 Personal history of pulmonary embolism: Secondary | ICD-10-CM | POA: Diagnosis not present

## 2017-05-10 DIAGNOSIS — Z7901 Long term (current) use of anticoagulants: Secondary | ICD-10-CM | POA: Diagnosis not present

## 2017-05-10 DIAGNOSIS — D6851 Activated protein C resistance: Secondary | ICD-10-CM | POA: Diagnosis not present

## 2017-05-12 DIAGNOSIS — I82409 Acute embolism and thrombosis of unspecified deep veins of unspecified lower extremity: Secondary | ICD-10-CM | POA: Diagnosis not present

## 2017-05-12 DIAGNOSIS — Z7901 Long term (current) use of anticoagulants: Secondary | ICD-10-CM | POA: Diagnosis not present

## 2017-05-12 DIAGNOSIS — D6851 Activated protein C resistance: Secondary | ICD-10-CM | POA: Diagnosis not present

## 2017-05-12 DIAGNOSIS — Z86711 Personal history of pulmonary embolism: Secondary | ICD-10-CM | POA: Diagnosis not present

## 2017-05-16 DIAGNOSIS — F329 Major depressive disorder, single episode, unspecified: Secondary | ICD-10-CM | POA: Diagnosis not present

## 2017-05-16 DIAGNOSIS — L97911 Non-pressure chronic ulcer of unspecified part of right lower leg limited to breakdown of skin: Secondary | ICD-10-CM | POA: Diagnosis not present

## 2017-05-16 DIAGNOSIS — L03115 Cellulitis of right lower limb: Secondary | ICD-10-CM | POA: Diagnosis not present

## 2017-05-16 DIAGNOSIS — Z888 Allergy status to other drugs, medicaments and biological substances status: Secondary | ICD-10-CM | POA: Diagnosis not present

## 2017-05-16 DIAGNOSIS — L03116 Cellulitis of left lower limb: Secondary | ICD-10-CM | POA: Diagnosis not present

## 2017-05-16 DIAGNOSIS — M1990 Unspecified osteoarthritis, unspecified site: Secondary | ICD-10-CM | POA: Diagnosis not present

## 2017-05-16 DIAGNOSIS — G4730 Sleep apnea, unspecified: Secondary | ICD-10-CM | POA: Diagnosis not present

## 2017-05-16 DIAGNOSIS — Z86718 Personal history of other venous thrombosis and embolism: Secondary | ICD-10-CM | POA: Diagnosis not present

## 2017-05-16 DIAGNOSIS — E039 Hypothyroidism, unspecified: Secondary | ICD-10-CM | POA: Diagnosis not present

## 2017-05-16 DIAGNOSIS — I110 Hypertensive heart disease with heart failure: Secondary | ICD-10-CM | POA: Diagnosis not present

## 2017-05-16 DIAGNOSIS — E291 Testicular hypofunction: Secondary | ICD-10-CM | POA: Diagnosis not present

## 2017-05-16 DIAGNOSIS — R600 Localized edema: Secondary | ICD-10-CM | POA: Diagnosis not present

## 2017-05-16 DIAGNOSIS — D6851 Activated protein C resistance: Secondary | ICD-10-CM | POA: Diagnosis not present

## 2017-05-16 DIAGNOSIS — Z7982 Long term (current) use of aspirin: Secondary | ICD-10-CM | POA: Diagnosis not present

## 2017-05-16 DIAGNOSIS — Z79899 Other long term (current) drug therapy: Secondary | ICD-10-CM | POA: Diagnosis not present

## 2017-05-16 DIAGNOSIS — R509 Fever, unspecified: Secondary | ICD-10-CM | POA: Diagnosis not present

## 2017-05-16 DIAGNOSIS — F419 Anxiety disorder, unspecified: Secondary | ICD-10-CM | POA: Diagnosis not present

## 2017-05-16 DIAGNOSIS — Z91041 Radiographic dye allergy status: Secondary | ICD-10-CM | POA: Diagnosis not present

## 2017-05-16 DIAGNOSIS — E11628 Type 2 diabetes mellitus with other skin complications: Secondary | ICD-10-CM | POA: Diagnosis not present

## 2017-05-16 DIAGNOSIS — Z8614 Personal history of Methicillin resistant Staphylococcus aureus infection: Secondary | ICD-10-CM | POA: Diagnosis not present

## 2017-05-16 DIAGNOSIS — Z86711 Personal history of pulmonary embolism: Secondary | ICD-10-CM | POA: Diagnosis not present

## 2017-05-16 DIAGNOSIS — Z7901 Long term (current) use of anticoagulants: Secondary | ICD-10-CM | POA: Diagnosis not present

## 2017-05-16 DIAGNOSIS — K219 Gastro-esophageal reflux disease without esophagitis: Secondary | ICD-10-CM | POA: Diagnosis not present

## 2017-05-16 DIAGNOSIS — L97921 Non-pressure chronic ulcer of unspecified part of left lower leg limited to breakdown of skin: Secondary | ICD-10-CM | POA: Diagnosis not present

## 2017-05-16 DIAGNOSIS — I872 Venous insufficiency (chronic) (peripheral): Secondary | ICD-10-CM | POA: Diagnosis not present

## 2017-05-16 DIAGNOSIS — I509 Heart failure, unspecified: Secondary | ICD-10-CM | POA: Diagnosis not present

## 2017-05-16 DIAGNOSIS — E119 Type 2 diabetes mellitus without complications: Secondary | ICD-10-CM | POA: Diagnosis not present

## 2017-05-17 ENCOUNTER — Telehealth: Payer: Self-pay

## 2017-05-17 NOTE — Telephone Encounter (Signed)
Zigmund Gottron, a pharmacist with [facility] [phone], called and Stated that patient needs his INR checked tomorrow at his appointment with you.  She would like a home health referral for him as he has a wound that needs attention, is on antibiotics, and has been having diarrhea, and they could do his INR also.  She said  She could manage his coumadin doses if you would like.  Please advise.

## 2017-05-18 ENCOUNTER — Encounter: Payer: Self-pay | Admitting: Osteopathic Medicine

## 2017-05-18 ENCOUNTER — Ambulatory Visit: Payer: Medicare Other | Admitting: Osteopathic Medicine

## 2017-05-18 ENCOUNTER — Ambulatory Visit (INDEPENDENT_AMBULATORY_CARE_PROVIDER_SITE_OTHER): Payer: Medicare Other | Admitting: Osteopathic Medicine

## 2017-05-18 VITALS — BP 115/75 | HR 87

## 2017-05-18 DIAGNOSIS — I739 Peripheral vascular disease, unspecified: Secondary | ICD-10-CM

## 2017-05-18 DIAGNOSIS — Z5189 Encounter for other specified aftercare: Secondary | ICD-10-CM | POA: Diagnosis not present

## 2017-05-18 DIAGNOSIS — Z5181 Encounter for therapeutic drug level monitoring: Secondary | ICD-10-CM | POA: Diagnosis not present

## 2017-05-18 DIAGNOSIS — E1165 Type 2 diabetes mellitus with hyperglycemia: Secondary | ICD-10-CM | POA: Diagnosis not present

## 2017-05-18 DIAGNOSIS — E1159 Type 2 diabetes mellitus with other circulatory complications: Secondary | ICD-10-CM | POA: Diagnosis not present

## 2017-05-18 DIAGNOSIS — Z7901 Long term (current) use of anticoagulants: Secondary | ICD-10-CM

## 2017-05-18 DIAGNOSIS — I872 Venous insufficiency (chronic) (peripheral): Secondary | ICD-10-CM | POA: Diagnosis not present

## 2017-05-18 DIAGNOSIS — R5381 Other malaise: Secondary | ICD-10-CM | POA: Diagnosis not present

## 2017-05-18 DIAGNOSIS — IMO0002 Reserved for concepts with insufficient information to code with codable children: Secondary | ICD-10-CM

## 2017-05-18 DIAGNOSIS — I82519 Chronic embolism and thrombosis of unspecified femoral vein: Secondary | ICD-10-CM | POA: Diagnosis not present

## 2017-05-18 DIAGNOSIS — Z794 Long term (current) use of insulin: Secondary | ICD-10-CM

## 2017-05-18 DIAGNOSIS — I420 Dilated cardiomyopathy: Secondary | ICD-10-CM

## 2017-05-18 LAB — POCT GLYCOSYLATED HEMOGLOBIN (HGB A1C): Hemoglobin A1C: 8.4

## 2017-05-18 LAB — POCT INR: INR: 3.1

## 2017-05-18 MED ORDER — OXYCODONE HCL 10 MG PO TABS: 10.0000 mg | ORAL_TABLET | ORAL | 0 refills | Status: DC

## 2017-05-18 NOTE — Telephone Encounter (Signed)
Please call Zigmund Gottron, a pharmacist with [facility] [phone] - they requested this lab and they are managing his Coumadin dosing. INR 3.1

## 2017-05-18 NOTE — Telephone Encounter (Signed)
Called [facility] and lab results were given verbally. Rhonda Cunningham,CMA

## 2017-05-18 NOTE — Patient Instructions (Signed)
Plan:   I will make some calls about figuring out a pain management plan for you  For now, will STOP Fentanyl patches since these aren't sticking. Will START Oxycodone without Acetaminophen, taking every 4 hours as needed. Again, I will work on a plan for pain management.   Continue as you're doing with the insulin, same units but we are switching to an alternative pen since your A1C is looking good on 25 Units.   Follow as directed with your specialists.

## 2017-05-18 NOTE — Progress Notes (Signed)
HPI: Mark Dean is a 51 y.o. male  who presents to [place] today, 05/18/17,  for chief complaint of:  Chief Complaint  Patient presents with  . Follow-up    PAIN AND DIABETES    Pain: Fentanyl patches kept coming off. Otherwise stable  DM2: up to 25 units Insulin daily. A1C shows good improvement.   Cardiac: overdue for cardio f/u, didn't know about diuresis increase for weight gain but since re-education last visit he has been taking torsemide as directed.  Skin: Home health had stopped coming - see previous notes. New referral was placed last visit. Seen in ED few days ago, labs ok and discharged on clindamycin w/ followup w/ wound care - has appt tomorrow w/ Dr. Phylis Bougie. ED did not dress wounds with wet dressing and pt's bandages have serous fluid and blood oozing through.   Back pain: limits ambulation. We transitioned to Fentanyl patched 25 mcg at last visit with Oxycodone for breakthrough pain.   Constipation: worse on increased dose opiates, only taking Metamucil, discussed bowel regimen lest visit.   Vascular - Hx DVT: Is now taking Coumadin and following with Coumadin Clinic.   Has not followed up with the [facility] re: drug coverage.  Has not heard back about home health      Past medical history, surgical history, social history and family history reviewed.  Patient Active Problem List   Diagnosis Date Noted  . Hypogonadism 04/15/2017  . Atypical atrial flutter ([place]) 02/22/2017  . Ischemic cardiomyopathy 02/22/2017  . S/P coronary artery stent placement 02/22/2017  . Acute bilateral deep vein thrombosis (DVT) of femoral veins ([place]) 02/21/2017  . Acute renal failure with acute cortical necrosis ([place]) 02/21/2017  . Chronic diarrhea 02/17/2017  . Debility 02/17/2017  . Depression, recurrent ([place]) 02/10/2017  . Other chronic pain 02/10/2017  . History of pulmonary embolism 02/10/2017  . Acquired hypothyroidism 02/10/2017  .  Uncontrolled type 2 diabetes mellitus with complication, with long-term current use of insulin ([place]) 02/10/2017  . Drug-seeking behavior 04/19/2016  . Cellulitis 04/18/2016  . Bilateral diabetic foot ulcer associated with secondary diabetes mellitus ([place]) 01/25/2016  . Edema extremities 12/07/2015  . QT prolongation 12/07/2015  . MDD (major depressive disorder), recurrent severe, without psychosis ([place]) 11/19/2015  . Dehiscence of surgical wound 11/09/2015  . Delayed surgical wound healing 11/03/2015  . Secondary lymphedema 11/03/2015  . Acute on chronic systolic heart failure ([place]) 10/23/2015  . Volume overload 10/23/2015  . S/P right knee surgery 10/19/2015  . Chronic venous insufficiency 12/23/2014  . PVD (peripheral vascular disease) ([place]) 12/23/2014  . Onychomycosis due to dermatophyte 09/12/2013  . Enthesopathy of foot 09/12/2013  . Keratosis 09/12/2013  . Depression, major 09/03/2013  . Generalized anxiety disorder 09/01/2013  . Bilateral leg pain 07/22/2013  . DVT, femoral, chronic ([place]) 07/10/2013  . Stasis dermatitis of both legs 07/10/2013  . Lumbar stenosis 09/21/2012  . Spinal stenosis of lumbar region 09/21/2012  . CAD (coronary artery disease) 09/16/2012  . Essential hypertension 11/21/2011  . Obstructive sleep apnea syndrome in adult 11/21/2011  . Pulmonary embolism ([place]) 11/21/2011  . Heterozygous factor V Leiden mutation ([place]) 11/17/2011  . Lymphedema of lower extremity 10/19/2011  . Atherosclerosis of native coronary artery of native heart without angina pectoris 02/22/2011  . Dilated cardiomyopathy ([place]) 02/22/2011  . Mixed diabetic hyperlipidemia associated with type 2 diabetes mellitus ([place]) 02/22/2011  . Morbid obesity with BMI of 50.0-59.9, adult ([place]) 07/01/2009    Current medication  list and allergy/intolerance information reviewed.   Current Outpatient Prescriptions on File Prior to Visit  Medication Sig Dispense Refill  . amiodarone (PACERONE) 200  MG tablet Take 200 mg by mouth daily.    Marland Kitchen aspirin EC 81 MG tablet Take 81 mg by mouth daily.    . blood glucose meter kit and supplies KIT Dispense based on patient and insurance preference. Use up to four times daily as directed. Please include lancets, test strips, control solution. 1 each 99  . carvedilol (COREG) 6.25 MG tablet Take 6.25 mg by mouth daily.    . digoxin (LANOXIN) 0.125 MG tablet Take 125 mcg by mouth every other day.    . docusate sodium (COLACE) 100 MG capsule Take 1 capsule (100 mg total) by mouth 2 (two) times daily. 60 capsule 1  . DULoxetine (CYMBALTA) 60 MG capsule Take 1 capsule (60 mg total) by mouth 2 (two) times daily. 180 capsule 1  . fentaNYL (DURAGESIC) 25 MCG/HR patch Place 1 patch (25 mcg total) onto the skin every 3 (three) days. 5 patch 0  . glucose blood test strip Use up to 4 times per day as directed with glucometer. Disp: 100. Refill x99 100 each 99  . hydrALAZINE (APRESOLINE) 25 MG tablet Take 25 mg by mouth 2 (two) times daily.    . Insulin Glargine (TOUJEO SOLOSTAR) 300 UNIT/ML SOPN Inject 30 Units into the skin daily. Increase by 3 units every 3 days to goal fasting blood sugar 80-130 3 mL 6  . Insulin Pen Needle 29G X 12MM MISC Use with Toujeo pen as directed 100 each 99  . Lancet Device MISC Use up to 4 times per day as directed with glucometer. Disp: 100. Refill x99 100 each 99  . levothyroxine (SYNTHROID, LEVOTHROID) 112 MCG tablet Take 1 tablet (112 mcg total) by mouth daily before breakfast. Plan to repeat labs approx 06/07/17 60 tablet 0  . metFORMIN (GLUCOPHAGE) 1000 MG tablet Take 1 tablet (1,000 mg total) by mouth 2 (two) times daily with a meal. 180 tablet 1  . oxyCODONE-acetaminophen (PERCOCET) 10-325 MG tablet Take 1 tablet by mouth every 8 (eight) hours as needed for pain (breakthrough pain). 120 tablet 0  . potassium chloride SA (K-DUR,KLOR-CON) 20 MEQ tablet Take 20 mEq by mouth daily.    . rivaroxaban (XARELTO) 20 MG TABS tablet Take 1  tablet (20 mg total) by mouth daily. 90 tablet 1  . torsemide (DEMADEX) 20 MG tablet Take 40 mg by mouth daily.     No current facility-administered medications on file prior to visit.    No Known Allergies    Review of Systems:  Constitutional: +fatigue, pain  Cardiac: No  chest pain, No  pressure, No palpitations, +LE swelling and weight gain  Respiratory:  No  shortness of breath.   Gastrointestinal: No  abdominal pain, +constipation  Musculoskeletal: No new myalgia/arthralgia  Skin: +wounds as per HPI  Psychiatric: +concerns with depression, No  concerns with anxiety  Exam:  BP 115/75   Pulse 87   Constitutional: VS see above. General Appearance: alert, disheveled but not filthy, obese, NAD  Eyes: Normal lids and conjunctive, non-icteric sclera  Neck: No masses, trachea midline.   Respiratory: Normal respiratory effort. no wheeze, no rhonchi, no rales  Cardiovascular: S1/S2 normal, no murmur, no rub/gallop auscultated. RRR. +1 LE edema - improved from last visit  Musculoskeletal: Symmetric and independent movement of all extremities  Neurological: Normal balance/coordination. No tremor.  Skin: weeping venous stasis ulcers  LE worse on L, no purulent drainage but +serious drainage and bleeding  Psychiatric: Normal judgment/insight. Cranky mood and affect. Oriented x3.      ASSESSMENT/PLAN: The primary encounter diagnosis was Uncontrolled type 2 diabetes mellitus with other circulatory complication, with long-term current use of insulin (). Diagnoses of Anticoagulation management encounter, Debility, PVD (peripheral vascular disease) ([place]), Dilated cardiomyopathy ([place]), Chronic venous insufficiency, Chronic deep vein thrombosis (DVT) of femoral vein, unspecified laterality ([place]), and Encounter for wound care were also pertinent to this visit.  Pain management challenges with this patient are reaching limits of my expertise and comfort level -  psychological/psychiatric issues are a barrier as well, patient is convinced nothing will be helpful. Will get him back on Oxycodone, he requests without Acetaminophen, and will work on getting palliative care involved. I spent some time on the phone trying to get some more information from local services - ideally would like him seen outpatient or home visit. Am waiting to hear back from a few places.   Spent a lot of time today dealing with wounds - had to soak gauze to get it off the legs d/t crusted and dried scabs and serous drainage. Rewrapped with [employer].   I believe he is suffering and I want to help, but he needs to work with me particularly with other medical issues - leg wounds and cardiac care in particular. He is instructed to obtain follow-up with cardiology ASAP d/t recent missed appt. He states he has appt in place in a few weeks  He is advised we will work to get him home health but he needs to recognize that if the state of his house presents a health hazard then they may not be able to send folks out. He insists that assisted living is not consistent with his goals and he will never go, he wants to function on his own and get pain under control. I agree with pain control, but I will be honest about my assessments of his ability to care for himself. Will work with home health for now. WIll follow up on why he has not heard back about a referral   Patient Instructions  Plan:   I will make some calls about figuring out a pain management plan for you  For now, will STOP Fentanyl patches since these aren't sticking. Will START Oxycodone without Acetaminophen, taking every 4 hours as needed. Again, I will work on a plan for pain management.   Continue as you're doing with the insulin, same units but we are switching to an alternative pen since your A1C is looking good on 25 Units.   Follow as directed with your specialists.    Follow-up plan: Return for recheck 1-2  weeks.  Visit summary with medication list and pertinent instructions was printed for patient to review, alert [place] if any changes needed. All questions at time of visit were answered - patient instructed to contact office with any additional concerns. ER/RTC precautions were reviewed with the patient and understanding verbalized.   Note: Total time spent 70 minutes, greater than 50% of the visit was spent face-to-face counseling and coordinating care for the following: The primary encounter diagnosis was Diabetes mellitus without complication ([place]). A diagnosis of Anticoagulation management encounter was also pertinent to this visit.Marland Kitchen

## 2017-05-18 NOTE — Telephone Encounter (Signed)
Dr. Sheppard Coil this phone encounter is for you. Talana Slatten,CMA

## 2017-05-19 ENCOUNTER — Other Ambulatory Visit: Payer: Self-pay | Admitting: Osteopathic Medicine

## 2017-05-19 ENCOUNTER — Telehealth: Payer: Self-pay

## 2017-05-19 DIAGNOSIS — L97919 Non-pressure chronic ulcer of unspecified part of right lower leg with unspecified severity: Secondary | ICD-10-CM | POA: Diagnosis not present

## 2017-05-19 DIAGNOSIS — Z515 Encounter for palliative care: Secondary | ICD-10-CM | POA: Diagnosis not present

## 2017-05-19 DIAGNOSIS — L97929 Non-pressure chronic ulcer of unspecified part of left lower leg with unspecified severity: Secondary | ICD-10-CM | POA: Diagnosis not present

## 2017-05-19 DIAGNOSIS — I83019 Varicose veins of right lower extremity with ulcer of unspecified site: Secondary | ICD-10-CM | POA: Diagnosis not present

## 2017-05-19 DIAGNOSIS — I10 Essential (primary) hypertension: Secondary | ICD-10-CM | POA: Diagnosis not present

## 2017-05-19 DIAGNOSIS — I872 Venous insufficiency (chronic) (peripheral): Secondary | ICD-10-CM | POA: Diagnosis not present

## 2017-05-19 DIAGNOSIS — I255 Ischemic cardiomyopathy: Secondary | ICD-10-CM | POA: Diagnosis not present

## 2017-05-19 DIAGNOSIS — I83029 Varicose veins of left lower extremity with ulcer of unspecified site: Secondary | ICD-10-CM | POA: Diagnosis not present

## 2017-05-19 DIAGNOSIS — I739 Peripheral vascular disease, unspecified: Secondary | ICD-10-CM | POA: Diagnosis not present

## 2017-05-19 MED ORDER — OXYCODONE HCL 10 MG PO TABS: 10.0000 mg | ORAL_TABLET | ORAL | 0 refills | Status: DC

## 2017-05-19 NOTE — Telephone Encounter (Signed)
Left detailed message on patient vm with instructions as noted below. Advised patient to call the office back if he had any questions or concerns. Rhonda Cunningham,CMA

## 2017-05-19 NOTE — Telephone Encounter (Signed)
Patient called and wants to know if he can increase the dose of his pain medication. He stated that they work but just not long enough. Please advise. Rhonda Cunningham,CMA

## 2017-05-19 NOTE — Progress Notes (Signed)
Pt states is helping but wearing off, will increase Pain mgt / palliative referral pending callbacks, I've left a few messages - looking to arrange outpatient pallitative consult ideally

## 2017-05-19 NOTE — Telephone Encounter (Signed)
Can go up to every 3 hours Still waiting to hear back from some folks about a pain management follow-up plan for him

## 2017-05-22 DIAGNOSIS — I872 Venous insufficiency (chronic) (peripheral): Secondary | ICD-10-CM | POA: Diagnosis not present

## 2017-05-22 DIAGNOSIS — Z6841 Body Mass Index (BMI) 40.0 and over, adult: Secondary | ICD-10-CM | POA: Diagnosis not present

## 2017-05-22 DIAGNOSIS — Z515 Encounter for palliative care: Secondary | ICD-10-CM | POA: Diagnosis not present

## 2017-05-22 DIAGNOSIS — I890 Lymphedema, not elsewhere classified: Secondary | ICD-10-CM | POA: Diagnosis not present

## 2017-05-22 DIAGNOSIS — L97921 Non-pressure chronic ulcer of unspecified part of left lower leg limited to breakdown of skin: Secondary | ICD-10-CM | POA: Diagnosis not present

## 2017-05-22 DIAGNOSIS — I10 Essential (primary) hypertension: Secondary | ICD-10-CM | POA: Diagnosis not present

## 2017-05-22 DIAGNOSIS — T8131XD Disruption of external operation (surgical) wound, not elsewhere classified, subsequent encounter: Secondary | ICD-10-CM | POA: Diagnosis not present

## 2017-05-22 DIAGNOSIS — I255 Ischemic cardiomyopathy: Secondary | ICD-10-CM | POA: Diagnosis not present

## 2017-05-24 ENCOUNTER — Telehealth: Payer: Self-pay | Admitting: Osteopathic Medicine

## 2017-05-24 DIAGNOSIS — I5022 Chronic systolic (congestive) heart failure: Secondary | ICD-10-CM | POA: Diagnosis not present

## 2017-05-24 DIAGNOSIS — I255 Ischemic cardiomyopathy: Secondary | ICD-10-CM | POA: Diagnosis not present

## 2017-05-24 DIAGNOSIS — I420 Dilated cardiomyopathy: Secondary | ICD-10-CM | POA: Diagnosis not present

## 2017-05-24 DIAGNOSIS — E1140 Type 2 diabetes mellitus with diabetic neuropathy, unspecified: Secondary | ICD-10-CM | POA: Diagnosis not present

## 2017-05-24 DIAGNOSIS — I110 Hypertensive heart disease with heart failure: Secondary | ICD-10-CM | POA: Diagnosis not present

## 2017-05-24 DIAGNOSIS — E1151 Type 2 diabetes mellitus with diabetic peripheral angiopathy without gangrene: Secondary | ICD-10-CM | POA: Diagnosis not present

## 2017-05-24 DIAGNOSIS — I484 Atypical atrial flutter: Secondary | ICD-10-CM | POA: Diagnosis not present

## 2017-05-24 DIAGNOSIS — Z7982 Long term (current) use of aspirin: Secondary | ICD-10-CM | POA: Diagnosis not present

## 2017-05-24 DIAGNOSIS — G4733 Obstructive sleep apnea (adult) (pediatric): Secondary | ICD-10-CM | POA: Diagnosis not present

## 2017-05-24 DIAGNOSIS — F411 Generalized anxiety disorder: Secondary | ICD-10-CM | POA: Diagnosis not present

## 2017-05-24 DIAGNOSIS — E782 Mixed hyperlipidemia: Secondary | ICD-10-CM | POA: Diagnosis not present

## 2017-05-24 DIAGNOSIS — I872 Venous insufficiency (chronic) (peripheral): Secondary | ICD-10-CM | POA: Diagnosis not present

## 2017-05-24 DIAGNOSIS — Z7901 Long term (current) use of anticoagulants: Secondary | ICD-10-CM | POA: Diagnosis not present

## 2017-05-24 DIAGNOSIS — G8929 Other chronic pain: Secondary | ICD-10-CM | POA: Diagnosis not present

## 2017-05-24 DIAGNOSIS — R5381 Other malaise: Secondary | ICD-10-CM

## 2017-05-24 DIAGNOSIS — F332 Major depressive disorder, recurrent severe without psychotic features: Secondary | ICD-10-CM | POA: Diagnosis not present

## 2017-05-24 DIAGNOSIS — L97821 Non-pressure chronic ulcer of other part of left lower leg limited to breakdown of skin: Secondary | ICD-10-CM | POA: Diagnosis not present

## 2017-05-24 DIAGNOSIS — I2510 Atherosclerotic heart disease of native coronary artery without angina pectoris: Secondary | ICD-10-CM | POA: Diagnosis not present

## 2017-05-24 DIAGNOSIS — Z792 Long term (current) use of antibiotics: Secondary | ICD-10-CM | POA: Diagnosis not present

## 2017-05-24 DIAGNOSIS — M48061 Spinal stenosis, lumbar region without neurogenic claudication: Secondary | ICD-10-CM | POA: Diagnosis not present

## 2017-05-24 DIAGNOSIS — Z0189 Encounter for other specified special examinations: Secondary | ICD-10-CM

## 2017-05-24 DIAGNOSIS — M6281 Muscle weakness (generalized): Secondary | ICD-10-CM | POA: Diagnosis not present

## 2017-05-24 DIAGNOSIS — E1169 Type 2 diabetes mellitus with other specified complication: Secondary | ICD-10-CM | POA: Diagnosis not present

## 2017-05-24 DIAGNOSIS — L97811 Non-pressure chronic ulcer of other part of right lower leg limited to breakdown of skin: Secondary | ICD-10-CM | POA: Diagnosis not present

## 2017-05-24 DIAGNOSIS — I890 Lymphedema, not elsewhere classified: Secondary | ICD-10-CM | POA: Diagnosis not present

## 2017-05-24 NOTE — Telephone Encounter (Signed)
Order for home PT, thanks!

## 2017-05-25 DIAGNOSIS — M6281 Muscle weakness (generalized): Secondary | ICD-10-CM | POA: Diagnosis not present

## 2017-05-25 DIAGNOSIS — L97811 Non-pressure chronic ulcer of other part of right lower leg limited to breakdown of skin: Secondary | ICD-10-CM | POA: Diagnosis not present

## 2017-05-25 DIAGNOSIS — L97821 Non-pressure chronic ulcer of other part of left lower leg limited to breakdown of skin: Secondary | ICD-10-CM | POA: Diagnosis not present

## 2017-05-25 DIAGNOSIS — I872 Venous insufficiency (chronic) (peripheral): Secondary | ICD-10-CM | POA: Diagnosis not present

## 2017-05-25 DIAGNOSIS — I890 Lymphedema, not elsewhere classified: Secondary | ICD-10-CM | POA: Diagnosis not present

## 2017-05-25 DIAGNOSIS — E1151 Type 2 diabetes mellitus with diabetic peripheral angiopathy without gangrene: Secondary | ICD-10-CM | POA: Diagnosis not present

## 2017-05-29 DIAGNOSIS — E1151 Type 2 diabetes mellitus with diabetic peripheral angiopathy without gangrene: Secondary | ICD-10-CM | POA: Diagnosis not present

## 2017-05-29 DIAGNOSIS — L97811 Non-pressure chronic ulcer of other part of right lower leg limited to breakdown of skin: Secondary | ICD-10-CM | POA: Diagnosis not present

## 2017-05-29 DIAGNOSIS — I872 Venous insufficiency (chronic) (peripheral): Secondary | ICD-10-CM | POA: Diagnosis not present

## 2017-05-29 DIAGNOSIS — I890 Lymphedema, not elsewhere classified: Secondary | ICD-10-CM | POA: Diagnosis not present

## 2017-05-29 DIAGNOSIS — L97821 Non-pressure chronic ulcer of other part of left lower leg limited to breakdown of skin: Secondary | ICD-10-CM | POA: Diagnosis not present

## 2017-05-29 DIAGNOSIS — M6281 Muscle weakness (generalized): Secondary | ICD-10-CM | POA: Diagnosis not present

## 2017-05-31 ENCOUNTER — Ambulatory Visit (INDEPENDENT_AMBULATORY_CARE_PROVIDER_SITE_OTHER): Payer: Medicare Other | Admitting: Osteopathic Medicine

## 2017-05-31 VITALS — BP 148/77 | HR 91

## 2017-05-31 DIAGNOSIS — I255 Ischemic cardiomyopathy: Secondary | ICD-10-CM

## 2017-05-31 DIAGNOSIS — M48062 Spinal stenosis, lumbar region with neurogenic claudication: Secondary | ICD-10-CM | POA: Diagnosis not present

## 2017-05-31 DIAGNOSIS — L97811 Non-pressure chronic ulcer of other part of right lower leg limited to breakdown of skin: Secondary | ICD-10-CM | POA: Diagnosis not present

## 2017-05-31 DIAGNOSIS — G894 Chronic pain syndrome: Secondary | ICD-10-CM | POA: Diagnosis not present

## 2017-05-31 DIAGNOSIS — L97821 Non-pressure chronic ulcer of other part of left lower leg limited to breakdown of skin: Secondary | ICD-10-CM | POA: Diagnosis not present

## 2017-05-31 DIAGNOSIS — M6281 Muscle weakness (generalized): Secondary | ICD-10-CM | POA: Diagnosis not present

## 2017-05-31 DIAGNOSIS — E039 Hypothyroidism, unspecified: Secondary | ICD-10-CM | POA: Diagnosis not present

## 2017-05-31 DIAGNOSIS — I890 Lymphedema, not elsewhere classified: Secondary | ICD-10-CM | POA: Diagnosis not present

## 2017-05-31 DIAGNOSIS — E1151 Type 2 diabetes mellitus with diabetic peripheral angiopathy without gangrene: Secondary | ICD-10-CM | POA: Diagnosis not present

## 2017-05-31 DIAGNOSIS — I872 Venous insufficiency (chronic) (peripheral): Secondary | ICD-10-CM | POA: Diagnosis not present

## 2017-05-31 LAB — CBC
HCT: 45.9 % (ref 38.5–50.0)
Hemoglobin: 15 g/dL (ref 13.2–17.1)
MCH: 29.1 pg (ref 27.0–33.0)
MCHC: 32.7 g/dL (ref 32.0–36.0)
MCV: 89 fL (ref 80.0–100.0)
MPV: 10.3 fL (ref 7.5–12.5)
PLATELETS: 298 10*3/uL (ref 140–400)
RBC: 5.16 MIL/uL (ref 4.20–5.80)
RDW: 15.7 % — AB (ref 11.0–15.0)
WBC: 7 10*3/uL (ref 3.8–10.8)

## 2017-05-31 MED ORDER — OXYCODONE HCL 10 MG PO TABS: 10.0000 mg | ORAL_TABLET | Freq: Two times a day (BID) | ORAL | 0 refills | Status: DC | PRN

## 2017-05-31 MED ORDER — DULOXETINE HCL 60 MG PO CPEP: 60.0000 mg | ORAL_CAPSULE | Freq: Two times a day (BID) | ORAL | 1 refills | Status: AC

## 2017-05-31 MED ORDER — MORPHINE SULFATE ER 30 MG PO TBCR: 30.0000 mg | EXTENDED_RELEASE_TABLET | Freq: Three times a day (TID) | ORAL | 0 refills | Status: DC

## 2017-05-31 NOTE — Progress Notes (Signed)
HPI: Mark Dean is a 51 y.o. male  who presents to [facility] today, 05/31/17,  for chief complaint of:  Chief Complaint  Patient presents with  . Back Pain    Back pain: Patient currently taking oxycodone 10 mg every 3 hours, about 8 times per day. States they work for the first hour or 2 and then seemed to wear off fairly quickly. We have been slowly titrating up on his opiate medications. Was unable to use Fentanyl patches since these were falling off.  Hypothyroidism: Due for recheck of TSH after medication adjustment months ago.  Cardiac: Controlled on current medications, no significant increase in lower extremity swelling, still needs to get follow-up with cardiology in place.  Wound care: Stable, home health is visiting for changing bandages. Swelling has improved.  Past medical history, surgical history, social history and family history reviewed.  Patient Active Problem List   Diagnosis Date Noted  . Hypogonadism 04/15/2017  . Atypical atrial flutter ([place]) 02/22/2017  . Ischemic cardiomyopathy 02/22/2017  . S/P coronary artery stent placement 02/22/2017  . Acute bilateral deep vein thrombosis (DVT) of femoral veins ([place]) 02/21/2017  . Acute renal failure with acute cortical necrosis ([place]) 02/21/2017  . Chronic diarrhea 02/17/2017  . Debility 02/17/2017  . Depression, recurrent ([place]) 02/10/2017  . Other chronic pain 02/10/2017  . History of pulmonary embolism 02/10/2017  . Acquired hypothyroidism 02/10/2017  . Uncontrolled type 2 diabetes mellitus with complication, with long-term current use of insulin ([place]) 02/10/2017  . Drug-seeking behavior 04/19/2016  . Cellulitis 04/18/2016  . Bilateral diabetic foot ulcer associated with secondary diabetes mellitus ([place]) 01/25/2016  . Edema extremities 12/07/2015  . QT prolongation 12/07/2015  . MDD (major depressive disorder), recurrent severe, without psychosis ([place]) 11/19/2015  .  Dehiscence of surgical wound 11/09/2015  . Delayed surgical wound healing 11/03/2015  . Secondary lymphedema 11/03/2015  . Acute on chronic systolic heart failure ([place]) 10/23/2015  . Volume overload 10/23/2015  . S/P right knee surgery 10/19/2015  . Chronic venous insufficiency 12/23/2014  . PVD (peripheral vascular disease) ([place]) 12/23/2014  . Onychomycosis due to dermatophyte 09/12/2013  . Enthesopathy of foot 09/12/2013  . Keratosis 09/12/2013  . Depression, major 09/03/2013  . Generalized anxiety disorder 09/01/2013  . Bilateral leg pain 07/22/2013  . DVT, femoral, chronic ([place]) 07/10/2013  . Stasis dermatitis of both legs 07/10/2013  . Lumbar stenosis 09/21/2012  . Spinal stenosis of lumbar region 09/21/2012  . CAD (coronary artery disease) 09/16/2012  . Essential hypertension 11/21/2011  . Obstructive sleep apnea syndrome in adult 11/21/2011  . Pulmonary embolism ([place]) 11/21/2011  . Heterozygous factor V Leiden mutation ([place]) 11/17/2011  . Lymphedema of lower extremity 10/19/2011  . Atherosclerosis of native coronary artery of native heart without angina pectoris 02/22/2011  . Dilated cardiomyopathy ([place]) 02/22/2011  . Mixed diabetic hyperlipidemia associated with type 2 diabetes mellitus ([place]) 02/22/2011  . Morbid obesity with BMI of 50.0-59.9, adult ([place]) 07/01/2009    Current medication list and allergy/intolerance information reviewed.   Current Outpatient Prescriptions on File Prior to Visit  Medication Sig Dispense Refill  . amiodarone (PACERONE) 200 MG tablet Take 200 mg by mouth daily.    Marland Kitchen aspirin EC 81 MG tablet Take 81 mg by mouth daily.    . blood glucose meter kit and supplies KIT Dispense based on patient and insurance preference. Use up to four times daily as directed. Please include lancets, test strips, control solution. 1 each 99  .  carvedilol (COREG) 6.25 MG tablet Take 6.25 mg by mouth daily.    . digoxin (LANOXIN) 0.125 MG tablet Take 125 mcg by mouth  every other day.    . docusate sodium (COLACE) 100 MG capsule Take 1 capsule (100 mg total) by mouth 2 (two) times daily. 60 capsule 1  . DULoxetine (CYMBALTA) 60 MG capsule Take 1 capsule (60 mg total) by mouth 2 (two) times daily. 180 capsule 1  . glucose blood test strip Use up to 4 times per day as directed with glucometer. Disp: 100. Refill x99 100 each 99  . hydrALAZINE (APRESOLINE) 25 MG tablet Take 25 mg by mouth 2 (two) times daily.    . Insulin Glargine (TOUJEO SOLOSTAR) 300 UNIT/ML SOPN Inject 30 Units into the skin daily. Increase by 3 units every 3 days to goal fasting blood sugar 80-130 3 mL 6  . Insulin Pen Needle 29G X 12MM MISC Use with Toujeo pen as directed 100 each 99  . Lancet Device MISC Use up to 4 times per day as directed with glucometer. Disp: 100. Refill x99 100 each 99  . levothyroxine (SYNTHROID, LEVOTHROID) 112 MCG tablet Take 1 tablet (112 mcg total) by mouth daily before breakfast. Plan to repeat labs approx 06/07/17 60 tablet 0  . metFORMIN (GLUCOPHAGE) 1000 MG tablet Take 1 tablet (1,000 mg total) by mouth 2 (two) times daily with a meal. 180 tablet 1  . Oxycodone HCl 10 MG TABS Take 1 tablet (10 mg total) by mouth every 3 (three) hours. As needed for severe pain 84 tablet 0  . potassium chloride SA (K-DUR,KLOR-CON) 20 MEQ tablet Take 20 mEq by mouth daily.    . rivaroxaban (XARELTO) 20 MG TABS tablet Take 1 tablet (20 mg total) by mouth daily. 90 tablet 1  . torsemide (DEMADEX) 20 MG tablet Take 40 mg by mouth daily.     No current facility-administered medications on file prior to visit.    No Known Allergies    Review of Systems:  Constitutional: No recent illness  Cardiac: No  chest pain, No  pressure, No palpitations  Respiratory:  No  shortness of breath.   Gastrointestinal: No  abdominal pain, no change on bowel habits  Musculoskeletal: No new myalgia/arthralgia, worsening of chronic back pain - see HPI  Neurologic: No  weakness, No   Dizziness  Psychiatric: No  concerns with depression, +concerns with anxiety  Exam:  BP (!) 148/77   Pulse 91   Constitutional: VS see above. General Appearance: alert, well-developed, well-nourished, NAD  Respiratory: Normal respiratory effort.    Musculoskeletal: Symmetric and independent movement of all extremities  Psychiatric: Normal judgment/insight. Normal mood and affect. Oriented x3. No SI/HI. No thought disorder.    Recent Results (from the past 2160 hour(s))  POCT HgB A1C     Status: None   Collection Time: 04/13/17 10:21 AM  Result Value Ref Range   Hemoglobin A1C 9.9   POCT Urinalysis Dipstick     Status: None   Collection Time: 04/13/17 10:22 AM  Result Value Ref Range   Color, UA YELLOW    Clarity, UA CLEAR    Glucose, UA NEGATIVE    Bilirubin, UA NEGATIVE    Ketones, UA NEGATIVE    Spec Grav, UA 1.010 1.010 - 1.025   Blood, UA NEGATIVE    pH, UA 6.0 5.0 - 8.0   Protein, UA NEGATIVE    Urobilinogen, UA 0.2 0.2 or 1.0 E.U./dL   Nitrite, UA NEGATIVE  Leukocytes, UA Negative Negative  CBC with Differential/Platelet     Status: Abnormal   Collection Time: 04/13/17 10:28 AM  Result Value Ref Range   WBC 8.7 3.8 - 10.8 K/uL   RBC 5.56 4.20 - 5.80 MIL/uL   Hemoglobin 15.8 13.2 - 17.1 g/dL   HCT 48.2 38.5 - 50.0 %   MCV 86.7 80.0 - 100.0 fL   MCH 28.4 27.0 - 33.0 pg   MCHC 32.8 32.0 - 36.0 g/dL   RDW 15.7 (H) 11.0 - 15.0 %   Platelets 302 140 - 400 K/uL   MPV 11.6 7.5 - 12.5 fL   Neutro Abs 5,829 1,500 - 7,800 cells/uL   Lymphs Abs 1,827 850 - 3,900 cells/uL   Monocytes Absolute 696 200 - 950 cells/uL   Eosinophils Absolute 261 15 - 500 cells/uL   Basophils Absolute 87 0 - 200 cells/uL   Neutrophils Relative % 67 %   Lymphocytes Relative 21 %   Monocytes Relative 8 %   Eosinophils Relative 3 %   Basophils Relative 1 %   Smear Review Criteria for review not met   COMPLETE METABOLIC PANEL WITH GFR     Status: Abnormal   Collection Time: 04/13/17  10:28 AM  Result Value Ref Range   Sodium 142 135 - 146 mmol/L   Potassium 4.3 3.5 - 5.3 mmol/L   Chloride 96 (L) 98 - 110 mmol/L   CO2 26 20 - 31 mmol/L   Glucose, Bld 255 (H) 65 - 99 mg/dL   BUN 28 (H) 7 - 25 mg/dL   Creat 1.25 0.70 - 1.33 mg/dL    Comment:   For patients > or = 52 years of age: The upper reference limit for Creatinine is approximately 13% higher for people identified as African-American.      Total Bilirubin 1.2 0.2 - 1.2 mg/dL   Alkaline Phosphatase 95 40 - 115 U/L   AST 15 10 - 35 U/L   ALT 10 9 - 46 U/L   Total Protein 6.8 6.1 - 8.1 g/dL   Albumin 4.0 3.6 - 5.1 g/dL   Calcium 9.6 8.6 - 10.3 mg/dL   Globulin 2.8 1.9 - 3.7 g/dL   AG Ratio 1.4 1.0 - 2.5 Ratio   BUN/Creatinine Ratio 22.4 (H) 6 - 22 Ratio   GFR, Est African American 77 >=60 mL/min   GFR, Est Non African American 66 >=60 mL/min  TSH     Status: Abnormal   Collection Time: 04/13/17 10:28 AM  Result Value Ref Range   TSH 7.51 (H) 0.40 - 4.50 mIU/L  POCT INR     Status: None   Collection Time: 05/18/17  9:56 AM  Result Value Ref Range   INR 3.1   POCT HgB A1C     Status: None   Collection Time: 05/18/17 10:04 AM  Result Value Ref Range   Hemoglobin A1C 8.4      ASSESSMENT/PLAN:   Chronic pain syndrome  Spinal stenosis of lumbar region with neurogenic claudication - Plan: CBC, COMPLETE METABOLIC PANEL WITH GFR, TSH  Hypothyroidism, unspecified type - Plan: CBC, TSH    Patient Instructions  Plan:  Labs today for medication safety and recheck thyroid  MS Contin: long-acting - try twice per day at first and can go up to three times per day  Oxycodone: short-acting - try twice per day with MS Contin ONLY as needed for breakthrough pain.   Any problems, please let me know!  If kidney  function comes back significantly abnormal, we may need to reduce dose/frequency of medication   Patient has capacity to understand risks versus benefits of these medications versus alternative  therapies. I advised him that with we are not able to get his pain under decent control with the MS Contin and as needed oxycodone for breakthrough pain, we may need to consider sending back to pain management for consideration of nerve stimulator. Patient has been faithful about keeping appointments, no concerns noted on [place] controlled substance database. We'll recheck renal function for medication safety.  Opioid equivalents calculated: 10 mg 8 times daily equals 80 mg of oxycodone, 1.5 morphine equivalents 120. Will initiate MS Contin at total dose 90 per day with oxycodone 10 mg up to twice per day. Consider escalation of therapy based on response to treatment.  Patient lives alone, no went to administer Narcan if this were to be an issue. Patient is advised on cautious titration up on opiates, risk of respiratory/neurologic depression. Patient excepts these risks and is motivated to do whatever he can to regain function and reduce pain  Follow-up plan: Return in about 1 week (around 06/07/2017) for recheck pain on new medication regimen.  Visit summary with medication list and pertinent instructions was printed for patient to review, alert [place] if any changes needed. All questions at time of visit were answered - patient instructed to contact office with any additional concerns. ER/RTC precautions were reviewed with the patient and understanding verbalized.

## 2017-05-31 NOTE — Patient Instructions (Signed)
Plan:  Labs today for medication safety and recheck thyroid  MS Contin: long-acting - try twice per day at first and can go up to three times per day  Oxycodone: short-acting - try twice per day with MS Contin ONLY as needed for breakthrough pain.   Any problems, please let me know!  If kidney function comes back significantly abnormal, we may need to reduce dose/frequency of medication

## 2017-06-01 DIAGNOSIS — I10 Essential (primary) hypertension: Secondary | ICD-10-CM | POA: Diagnosis not present

## 2017-06-01 DIAGNOSIS — Z515 Encounter for palliative care: Secondary | ICD-10-CM | POA: Diagnosis not present

## 2017-06-01 DIAGNOSIS — I5022 Chronic systolic (congestive) heart failure: Secondary | ICD-10-CM | POA: Diagnosis not present

## 2017-06-01 DIAGNOSIS — I420 Dilated cardiomyopathy: Secondary | ICD-10-CM | POA: Diagnosis not present

## 2017-06-01 DIAGNOSIS — R0602 Shortness of breath: Secondary | ICD-10-CM | POA: Diagnosis not present

## 2017-06-01 DIAGNOSIS — I255 Ischemic cardiomyopathy: Secondary | ICD-10-CM | POA: Diagnosis not present

## 2017-06-01 LAB — COMPLETE METABOLIC PANEL WITH GFR
ALT: 12 U/L (ref 9–46)
AST: 17 U/L (ref 10–35)
Albumin: 3.7 g/dL (ref 3.6–5.1)
Alkaline Phosphatase: 78 U/L (ref 40–115)
BILIRUBIN TOTAL: 1 mg/dL (ref 0.2–1.2)
BUN: 18 mg/dL (ref 7–25)
CO2: 28 mmol/L (ref 20–31)
Calcium: 9.6 mg/dL (ref 8.6–10.3)
Chloride: 101 mmol/L (ref 98–110)
Creat: 0.7 mg/dL (ref 0.70–1.33)
Glucose, Bld: 233 mg/dL — ABNORMAL HIGH (ref 65–99)
Potassium: 3.7 mmol/L (ref 3.5–5.3)
Sodium: 143 mmol/L (ref 135–146)
TOTAL PROTEIN: 6.9 g/dL (ref 6.1–8.1)

## 2017-06-01 LAB — TSH: TSH: 2.37 mIU/L (ref 0.40–4.50)

## 2017-06-02 DIAGNOSIS — L97811 Non-pressure chronic ulcer of other part of right lower leg limited to breakdown of skin: Secondary | ICD-10-CM | POA: Diagnosis not present

## 2017-06-02 DIAGNOSIS — L97821 Non-pressure chronic ulcer of other part of left lower leg limited to breakdown of skin: Secondary | ICD-10-CM | POA: Diagnosis not present

## 2017-06-02 DIAGNOSIS — I872 Venous insufficiency (chronic) (peripheral): Secondary | ICD-10-CM | POA: Diagnosis not present

## 2017-06-02 DIAGNOSIS — E1151 Type 2 diabetes mellitus with diabetic peripheral angiopathy without gangrene: Secondary | ICD-10-CM | POA: Diagnosis not present

## 2017-06-02 DIAGNOSIS — M6281 Muscle weakness (generalized): Secondary | ICD-10-CM | POA: Diagnosis not present

## 2017-06-02 DIAGNOSIS — I890 Lymphedema, not elsewhere classified: Secondary | ICD-10-CM | POA: Diagnosis not present

## 2017-06-03 DIAGNOSIS — E1151 Type 2 diabetes mellitus with diabetic peripheral angiopathy without gangrene: Secondary | ICD-10-CM | POA: Diagnosis not present

## 2017-06-03 DIAGNOSIS — L97811 Non-pressure chronic ulcer of other part of right lower leg limited to breakdown of skin: Secondary | ICD-10-CM | POA: Diagnosis not present

## 2017-06-03 DIAGNOSIS — M6281 Muscle weakness (generalized): Secondary | ICD-10-CM | POA: Diagnosis not present

## 2017-06-03 DIAGNOSIS — L97821 Non-pressure chronic ulcer of other part of left lower leg limited to breakdown of skin: Secondary | ICD-10-CM | POA: Diagnosis not present

## 2017-06-03 DIAGNOSIS — I872 Venous insufficiency (chronic) (peripheral): Secondary | ICD-10-CM | POA: Diagnosis not present

## 2017-06-03 DIAGNOSIS — I890 Lymphedema, not elsewhere classified: Secondary | ICD-10-CM | POA: Diagnosis not present

## 2017-06-05 DIAGNOSIS — L97821 Non-pressure chronic ulcer of other part of left lower leg limited to breakdown of skin: Secondary | ICD-10-CM | POA: Diagnosis not present

## 2017-06-05 DIAGNOSIS — M6281 Muscle weakness (generalized): Secondary | ICD-10-CM | POA: Diagnosis not present

## 2017-06-05 DIAGNOSIS — L97811 Non-pressure chronic ulcer of other part of right lower leg limited to breakdown of skin: Secondary | ICD-10-CM | POA: Diagnosis not present

## 2017-06-05 DIAGNOSIS — I872 Venous insufficiency (chronic) (peripheral): Secondary | ICD-10-CM | POA: Diagnosis not present

## 2017-06-05 DIAGNOSIS — E1151 Type 2 diabetes mellitus with diabetic peripheral angiopathy without gangrene: Secondary | ICD-10-CM | POA: Diagnosis not present

## 2017-06-05 DIAGNOSIS — I890 Lymphedema, not elsewhere classified: Secondary | ICD-10-CM | POA: Diagnosis not present

## 2017-06-08 DIAGNOSIS — I872 Venous insufficiency (chronic) (peripheral): Secondary | ICD-10-CM | POA: Diagnosis not present

## 2017-06-08 DIAGNOSIS — L97811 Non-pressure chronic ulcer of other part of right lower leg limited to breakdown of skin: Secondary | ICD-10-CM | POA: Diagnosis not present

## 2017-06-08 DIAGNOSIS — M6281 Muscle weakness (generalized): Secondary | ICD-10-CM | POA: Diagnosis not present

## 2017-06-08 DIAGNOSIS — I890 Lymphedema, not elsewhere classified: Secondary | ICD-10-CM | POA: Diagnosis not present

## 2017-06-08 DIAGNOSIS — L97821 Non-pressure chronic ulcer of other part of left lower leg limited to breakdown of skin: Secondary | ICD-10-CM | POA: Diagnosis not present

## 2017-06-08 DIAGNOSIS — E1151 Type 2 diabetes mellitus with diabetic peripheral angiopathy without gangrene: Secondary | ICD-10-CM | POA: Diagnosis not present

## 2017-06-12 DIAGNOSIS — I872 Venous insufficiency (chronic) (peripheral): Secondary | ICD-10-CM | POA: Diagnosis not present

## 2017-06-12 DIAGNOSIS — L97921 Non-pressure chronic ulcer of unspecified part of left lower leg limited to breakdown of skin: Secondary | ICD-10-CM | POA: Diagnosis not present

## 2017-06-12 DIAGNOSIS — I82413 Acute embolism and thrombosis of femoral vein, bilateral: Secondary | ICD-10-CM | POA: Diagnosis not present

## 2017-06-12 DIAGNOSIS — I890 Lymphedema, not elsewhere classified: Secondary | ICD-10-CM | POA: Diagnosis not present

## 2017-06-13 DIAGNOSIS — I872 Venous insufficiency (chronic) (peripheral): Secondary | ICD-10-CM | POA: Diagnosis not present

## 2017-06-13 DIAGNOSIS — E1151 Type 2 diabetes mellitus with diabetic peripheral angiopathy without gangrene: Secondary | ICD-10-CM | POA: Diagnosis not present

## 2017-06-13 DIAGNOSIS — M6281 Muscle weakness (generalized): Secondary | ICD-10-CM | POA: Diagnosis not present

## 2017-06-13 DIAGNOSIS — L97811 Non-pressure chronic ulcer of other part of right lower leg limited to breakdown of skin: Secondary | ICD-10-CM | POA: Diagnosis not present

## 2017-06-13 DIAGNOSIS — I890 Lymphedema, not elsewhere classified: Secondary | ICD-10-CM | POA: Diagnosis not present

## 2017-06-13 DIAGNOSIS — L97821 Non-pressure chronic ulcer of other part of left lower leg limited to breakdown of skin: Secondary | ICD-10-CM | POA: Diagnosis not present

## 2017-06-14 ENCOUNTER — Encounter: Payer: Self-pay | Admitting: Osteopathic Medicine

## 2017-06-14 ENCOUNTER — Ambulatory Visit (INDEPENDENT_AMBULATORY_CARE_PROVIDER_SITE_OTHER): Payer: Medicare Other | Admitting: Osteopathic Medicine

## 2017-06-14 VITALS — BP 117/71 | HR 85

## 2017-06-14 DIAGNOSIS — I255 Ischemic cardiomyopathy: Secondary | ICD-10-CM | POA: Diagnosis not present

## 2017-06-14 DIAGNOSIS — M48062 Spinal stenosis, lumbar region with neurogenic claudication: Secondary | ICD-10-CM | POA: Diagnosis not present

## 2017-06-14 DIAGNOSIS — E1159 Type 2 diabetes mellitus with other circulatory complications: Secondary | ICD-10-CM

## 2017-06-14 DIAGNOSIS — R5381 Other malaise: Secondary | ICD-10-CM

## 2017-06-14 DIAGNOSIS — I10 Essential (primary) hypertension: Secondary | ICD-10-CM | POA: Diagnosis not present

## 2017-06-14 DIAGNOSIS — G894 Chronic pain syndrome: Secondary | ICD-10-CM | POA: Diagnosis not present

## 2017-06-14 DIAGNOSIS — I152 Hypertension secondary to endocrine disorders: Secondary | ICD-10-CM

## 2017-06-14 MED ORDER — MORPHINE SULFATE ER 60 MG PO TBCR: 60.0000 mg | EXTENDED_RELEASE_TABLET | Freq: Two times a day (BID) | ORAL | 0 refills | Status: DC

## 2017-06-14 NOTE — Patient Instructions (Addendum)
Plan:  MS Contin 60 mg every 12 hours - take consistently  Oxycodone 10 mg two to three times per day as needed for breakthrough pain   Will plan for MRI and likely reevaluation with neurosurgery

## 2017-06-14 NOTE — Progress Notes (Signed)
HPI: Mark Dean is a 51 y.o. male  who presents to [facility] today, 06/14/17,  for chief complaint of:  Chief Complaint  Patient presents with  . Follow-up    Back pain:  Previously following with spine center at [facility], Dr. Atilano Ina. He underwent L2 to pelvis posterior spinal fusion with L4/L5 PLIF in 2013. Since that time, patient has struggled with chronic debilitating back and leg pain, neurogenic claudication type symptoms with burning in and down the legs, severe back pain, limited ambulation.   At this point, opiates have been titrated up about as far as I am willing to take them and I believe he needs to see pain management or neurosurgery for further discussion of treatment options. The long-acting opiates are much more helpful to him and in the short acting once, his goal is to regain mobility, lose weight, take better care of himself but at this point his pain is impacting his mobility to a degree where this has been difficult for him to do. He has adamantly refused assisted living, wants to maintain his independence. Another obstacle to getting good care for him has been lack of prescription drug coverage and inability to get established with the [place].   On 09/08/14, MR cervical spine. No significant central canal stenosis, mild degenerative disease with minimal disk bulge at L4-L5, L5-S1, at C3-C4, C4-C5. No significant foraminal stenosis.  On 09/08/14, MR thoracic spine. No significant central canal stenosis, mild degenerative changes without significant disk bulge, no foraminal stenosis.  On 09/08/14, MR lumbar spine poor image quality, mild scoliosis noted. No significant central canal stenosis, severe degenerative disk disease, status post fusion L3 through S1, some artifact from pedicles screws obscuring view. No significant foraminal stenosis noted.   Cardiac: Controlled on current medications, no significant increase in  lower extremity swelling, Recent follow-up with cardiology, patient states her medications were adjusted, he thinks lisinopril was started. I don't have a good medication list at this time and he did not bring her bottles with him.  Wound care: Stable, home health is visiting for changing bandages. Swelling has improved. Patient is following with wound care and is noting significant improvement in leg ulcers  Past medical history, surgical history, social history and family history reviewed.  Patient Active Problem List   Diagnosis Date Noted  . Hypogonadism 04/15/2017  . Atypical atrial flutter ([place]) 02/22/2017  . Ischemic cardiomyopathy 02/22/2017  . S/P coronary artery stent placement 02/22/2017  . Acute bilateral deep vein thrombosis (DVT) of femoral veins ([place]) 02/21/2017  . Acute renal failure with acute cortical necrosis ([place]) 02/21/2017  . Chronic diarrhea 02/17/2017  . Debility 02/17/2017  . Depression, recurrent ([place]) 02/10/2017  . Other chronic pain 02/10/2017  . History of pulmonary embolism 02/10/2017  . Acquired hypothyroidism 02/10/2017  . Uncontrolled type 2 diabetes mellitus with complication, with long-term current use of insulin ([place]) 02/10/2017  . Drug-seeking behavior 04/19/2016  . Cellulitis 04/18/2016  . Bilateral diabetic foot ulcer associated with secondary diabetes mellitus ([place]) 01/25/2016  . Edema extremities 12/07/2015  . QT prolongation 12/07/2015  . MDD (major depressive disorder), recurrent severe, without psychosis ([place]) 11/19/2015  . Dehiscence of surgical wound 11/09/2015  . Delayed surgical wound healing 11/03/2015  . Secondary lymphedema 11/03/2015  . Acute on chronic systolic heart failure ([place]) 10/23/2015  . Volume overload 10/23/2015  . S/P right knee surgery 10/19/2015  . Chronic venous insufficiency 12/23/2014  . PVD (peripheral vascular disease) ([place]) 12/23/2014  .  Onychomycosis due to dermatophyte 09/12/2013  . Enthesopathy of foot  09/12/2013  . Keratosis 09/12/2013  . Depression, major 09/03/2013  . Generalized anxiety disorder 09/01/2013  . Bilateral leg pain 07/22/2013  . DVT, femoral, chronic ([place]) 07/10/2013  . Stasis dermatitis of both legs 07/10/2013  . Lumbar stenosis 09/21/2012  . Spinal stenosis of lumbar region 09/21/2012  . CAD (coronary artery disease) 09/16/2012  . Essential hypertension 11/21/2011  . Obstructive sleep apnea syndrome in adult 11/21/2011  . Pulmonary embolism ([place]) 11/21/2011  . Heterozygous factor V Leiden mutation ([place]) 11/17/2011  . Lymphedema of lower extremity 10/19/2011  . Atherosclerosis of native coronary artery of native heart without angina pectoris 02/22/2011  . Dilated cardiomyopathy ([place]) 02/22/2011  . Mixed diabetic hyperlipidemia associated with type 2 diabetes mellitus ([place]) 02/22/2011  . Morbid obesity with BMI of 50.0-59.9, adult ([place]) 07/01/2009    Current medication list and allergy/intolerance information reviewed.   Current Outpatient Prescriptions on File Prior to Visit  Medication Sig Dispense Refill  . amiodarone (PACERONE) 200 MG tablet Take 200 mg by mouth daily.    Marland Kitchen aspirin EC 81 MG tablet Take 81 mg by mouth daily.    . blood glucose meter kit and supplies KIT Dispense based on patient and insurance preference. Use up to four times daily as directed. Please include lancets, test strips, control solution. 1 each 99  . carvedilol (COREG) 6.25 MG tablet Take 6.25 mg by mouth daily.    . digoxin (LANOXIN) 0.125 MG tablet Take 125 mcg by mouth every other day.    . docusate sodium (COLACE) 100 MG capsule Take 1 capsule (100 mg total) by mouth 2 (two) times daily. 60 capsule 1  . DULoxetine (CYMBALTA) 60 MG capsule Take 1 capsule (60 mg total) by mouth 2 (two) times daily. 180 capsule 1  . glucose blood test strip Use up to 4 times per day as directed with glucometer. Disp: 100. Refill x99 100 each 99  . hydrALAZINE (APRESOLINE) 25 MG tablet Take 25 mg by  mouth 2 (two) times daily.    . Insulin Glargine (TOUJEO SOLOSTAR) 300 UNIT/ML SOPN Inject 30 Units into the skin daily. Increase by 3 units every 3 days to goal fasting blood sugar 80-130 3 mL 6  . Insulin Pen Needle 29G X 12MM MISC Use with Toujeo pen as directed 100 each 99  . Lancet Device MISC Use up to 4 times per day as directed with glucometer. Disp: 100. Refill x99 100 each 99  . levothyroxine (SYNTHROID, LEVOTHROID) 112 MCG tablet Take 1 tablet (112 mcg total) by mouth daily before breakfast. Plan to repeat labs approx 06/07/17 60 tablet 0  . metFORMIN (GLUCOPHAGE) 1000 MG tablet Take 1 tablet (1,000 mg total) by mouth 2 (two) times daily with a meal. 180 tablet 1  . morphine (MS CONTIN) 30 MG 12 hr tablet Take 1 tablet (30 mg total) by mouth every 8 (eight) hours. #45 tablets for 2 weeks - appointment needed for further refills. DO NOT CRUSH/SPLIT 45 tablet 0  . Oxycodone HCl 10 MG TABS Take 1 tablet (10 mg total) by mouth every 12 (twelve) hours as needed (breakthrough pain). In addition to MS Contin 84 tablet 0  . potassium chloride SA (K-DUR,KLOR-CON) 20 MEQ tablet Take 20 mEq by mouth daily.    . rivaroxaban (XARELTO) 20 MG TABS tablet Take 1 tablet (20 mg total) by mouth daily. 90 tablet 1  . torsemide (DEMADEX) 20 MG tablet Take 40 mg  by mouth daily.     No current facility-administered medications on file prior to visit.    No Known Allergies    Review of Systems:  Constitutional: No recent illness  Cardiac: No  chest pain, No  pressure, No palpitations  Respiratory:  No  shortness of breath.   Musculoskeletal: No new myalgia/arthralgia, worsening of chronic back pain - see HPI  Neurologic: No  weakness, No  Dizziness  Psychiatric: No  concerns with depression, +concerns with anxiety  Exam:  BP 117/71   Pulse 85   Constitutional: VS see above. General Appearance: alert, well-developed, well-nourished, NAD  Respiratory: Normal respiratory effort.     Musculoskeletal: Symmetric and independent movement of all extremities  Psychiatric: Normal judgment/insight. Normal mood and affect. Oriented x3. No SI/HI. No thought disorder.    Recent Results (from the past 2160 hour(s))  POCT HgB A1C     Status: None   Collection Time: 04/13/17 10:21 AM  Result Value Ref Range   Hemoglobin A1C 9.9   POCT Urinalysis Dipstick     Status: None   Collection Time: 04/13/17 10:22 AM  Result Value Ref Range   Color, UA YELLOW    Clarity, UA CLEAR    Glucose, UA NEGATIVE    Bilirubin, UA NEGATIVE    Ketones, UA NEGATIVE    Spec Grav, UA 1.010 1.010 - 1.025   Blood, UA NEGATIVE    pH, UA 6.0 5.0 - 8.0   Protein, UA NEGATIVE    Urobilinogen, UA 0.2 0.2 or 1.0 E.U./dL   Nitrite, UA NEGATIVE    Leukocytes, UA Negative Negative  CBC with Differential/Platelet     Status: Abnormal   Collection Time: 04/13/17 10:28 AM  Result Value Ref Range   WBC 8.7 3.8 - 10.8 K/uL   RBC 5.56 4.20 - 5.80 MIL/uL   Hemoglobin 15.8 13.2 - 17.1 g/dL   HCT 48.2 38.5 - 50.0 %   MCV 86.7 80.0 - 100.0 fL   MCH 28.4 27.0 - 33.0 pg   MCHC 32.8 32.0 - 36.0 g/dL   RDW 15.7 (H) 11.0 - 15.0 %   Platelets 302 140 - 400 K/uL   MPV 11.6 7.5 - 12.5 fL   Neutro Abs 5,829 1,500 - 7,800 cells/uL   Lymphs Abs 1,827 850 - 3,900 cells/uL   Monocytes Absolute 696 200 - 950 cells/uL   Eosinophils Absolute 261 15 - 500 cells/uL   Basophils Absolute 87 0 - 200 cells/uL   Neutrophils Relative % 67 %   Lymphocytes Relative 21 %   Monocytes Relative 8 %   Eosinophils Relative 3 %   Basophils Relative 1 %   Smear Review Criteria for review not met   COMPLETE METABOLIC PANEL WITH GFR     Status: Abnormal   Collection Time: 04/13/17 10:28 AM  Result Value Ref Range   Sodium 142 135 - 146 mmol/L   Potassium 4.3 3.5 - 5.3 mmol/L   Chloride 96 (L) 98 - 110 mmol/L   CO2 26 20 - 31 mmol/L   Glucose, Bld 255 (H) 65 - 99 mg/dL   BUN 28 (H) 7 - 25 mg/dL   Creat 1.25 0.70 - 1.33 mg/dL     Comment:   For patients > or = 52 years of age: The upper reference limit for Creatinine is approximately 13% higher for people identified as African-American.      Total Bilirubin 1.2 0.2 - 1.2 mg/dL   Alkaline Phosphatase 95 40 -  115 U/L   AST 15 10 - 35 U/L   ALT 10 9 - 46 U/L   Total Protein 6.8 6.1 - 8.1 g/dL   Albumin 4.0 3.6 - 5.1 g/dL   Calcium 9.6 8.6 - 10.3 mg/dL   Globulin 2.8 1.9 - 3.7 g/dL   AG Ratio 1.4 1.0 - 2.5 Ratio   BUN/Creatinine Ratio 22.4 (H) 6 - 22 Ratio   GFR, Est African American 77 >=60 mL/min   GFR, Est Non African American 66 >=60 mL/min  TSH     Status: Abnormal   Collection Time: 04/13/17 10:28 AM  Result Value Ref Range   TSH 7.51 (H) 0.40 - 4.50 mIU/L  POCT INR     Status: None   Collection Time: 05/18/17  9:56 AM  Result Value Ref Range   INR 3.1   POCT HgB A1C     Status: None   Collection Time: 05/18/17 10:04 AM  Result Value Ref Range   Hemoglobin A1C 8.4   CBC     Status: Abnormal   Collection Time: 05/31/17 12:18 PM  Result Value Ref Range   WBC 7.0 3.8 - 10.8 K/uL   RBC 5.16 4.20 - 5.80 MIL/uL   Hemoglobin 15.0 13.2 - 17.1 g/dL   HCT 45.9 38.5 - 50.0 %   MCV 89.0 80.0 - 100.0 fL   MCH 29.1 27.0 - 33.0 pg   MCHC 32.7 32.0 - 36.0 g/dL   RDW 15.7 (H) 11.0 - 15.0 %   Platelets 298 140 - 400 K/uL   MPV 10.3 7.5 - 12.5 fL  COMPLETE METABOLIC PANEL WITH GFR     Status: Abnormal   Collection Time: 05/31/17 12:18 PM  Result Value Ref Range   Sodium 143 135 - 146 mmol/L   Potassium 3.7 3.5 - 5.3 mmol/L   Chloride 101 98 - 110 mmol/L   CO2 28 20 - 31 mmol/L   Glucose, Bld 233 (H) 65 - 99 mg/dL   BUN 18 7 - 25 mg/dL   Creat 0.70 0.70 - 1.33 mg/dL    Comment:   For patients > or = 52 years of age: The upper reference limit for Creatinine is approximately 13% higher for people identified as African-American.      Total Bilirubin 1.0 0.2 - 1.2 mg/dL   Alkaline Phosphatase 78 40 - 115 U/L   AST 17 10 - 35 U/L   ALT 12 9 - 46 U/L    Total Protein 6.9 6.1 - 8.1 g/dL   Albumin 3.7 3.6 - 5.1 g/dL   Calcium 9.6 8.6 - 10.3 mg/dL   GFR, Est African American >89 >=60 mL/min   GFR, Est Non African American >89 >=60 mL/min  TSH     Status: None   Collection Time: 05/31/17 12:18 PM  Result Value Ref Range   TSH 2.37 0.40 - 4.50 mIU/L     ASSESSMENT/PLAN:   Spinal stenosis of lumbar region with neurogenic claudication - Plan: MR Lumbar Spine Wo Contrast  Debility - Home health following, patient is participating in physical therapy - Plan: MR Lumbar Spine Wo Contrast  Chronic pain syndrome - On anticoagulation, caution with NSAID. Maximized on Cymbalta. Intolerant to gabapentin and Tylenol. Fentanyl patches were not sticking to skin - Plan: morphine (MS CONTIN) 60 MG 12 hr tablet, MR Lumbar Spine Wo Contrast  Hypertension associated with diabetes ([place]) - Plan: lisinopril (PRINIVIL,ZESTRIL) 5 MG tablet    Patient Instructions  Plan:  MS Contin  60 mg every 12 hours - take consistently  Oxycodone 10 mg two to three times per day as needed for breakthrough pain   Will plan for MRI and likely reevaluation with neurosurgery     Patient has capacity to understand risks versus benefits of these medications versus alternative therapies. Patient has been faithful about keeping appointments, no concerns noted on [place] controlled substance database.  Current opiod equivalents: 10 mg 2 times daily equals 20 mg of oxycodone, 1.5 morphine equivalents = 30 30 mg x2-3 times daily equals 60-90 mg morphine 45 + 60-90 = 105-135 opiate equivalents /24h   New opioid equivalents calculated:  10 mg 3 times daily equals 30 mg of oxycodone, 1.5 morphine equivalents = 45 60 mg x2 times daily equals 120 mg morphine 45 + 120 = 165 opiate equivalents /24h   Patient lives alone, no one to administer Narcan if this were to be an issue. Patient is advised on cautious titration up on opiates, risk of respiratory/neurologic  depression. Patient accepts these risks including the risk of death and is motivated to do whatever he can to regain function and reduce pain. He is willing to follow-up with neurosurgery or pain management after upcoming MRI to discuss nonpharmacological options for pain control  Follow-up plan: Return in about 4 weeks (around 07/12/2017) for recheck pain, review MRI and nruosurgery plan .  Visit summary with medication list and pertinent instructions was printed for patient to review, alert [place] if any changes needed. All questions at time of visit were answered - patient instructed to contact office with any additional concerns. ER/RTC precautions were reviewed with the patient and understanding verbalized.

## 2017-06-15 DIAGNOSIS — I890 Lymphedema, not elsewhere classified: Secondary | ICD-10-CM | POA: Diagnosis not present

## 2017-06-15 DIAGNOSIS — L97811 Non-pressure chronic ulcer of other part of right lower leg limited to breakdown of skin: Secondary | ICD-10-CM | POA: Diagnosis not present

## 2017-06-15 DIAGNOSIS — I872 Venous insufficiency (chronic) (peripheral): Secondary | ICD-10-CM | POA: Diagnosis not present

## 2017-06-15 DIAGNOSIS — M6281 Muscle weakness (generalized): Secondary | ICD-10-CM | POA: Diagnosis not present

## 2017-06-15 DIAGNOSIS — E1151 Type 2 diabetes mellitus with diabetic peripheral angiopathy without gangrene: Secondary | ICD-10-CM | POA: Diagnosis not present

## 2017-06-15 DIAGNOSIS — L97821 Non-pressure chronic ulcer of other part of left lower leg limited to breakdown of skin: Secondary | ICD-10-CM | POA: Diagnosis not present

## 2017-06-19 DIAGNOSIS — I890 Lymphedema, not elsewhere classified: Secondary | ICD-10-CM | POA: Diagnosis not present

## 2017-06-19 DIAGNOSIS — E1151 Type 2 diabetes mellitus with diabetic peripheral angiopathy without gangrene: Secondary | ICD-10-CM | POA: Diagnosis not present

## 2017-06-19 DIAGNOSIS — I872 Venous insufficiency (chronic) (peripheral): Secondary | ICD-10-CM | POA: Diagnosis not present

## 2017-06-19 DIAGNOSIS — L97821 Non-pressure chronic ulcer of other part of left lower leg limited to breakdown of skin: Secondary | ICD-10-CM | POA: Diagnosis not present

## 2017-06-19 DIAGNOSIS — L97811 Non-pressure chronic ulcer of other part of right lower leg limited to breakdown of skin: Secondary | ICD-10-CM | POA: Diagnosis not present

## 2017-06-19 DIAGNOSIS — M6281 Muscle weakness (generalized): Secondary | ICD-10-CM | POA: Diagnosis not present

## 2017-06-21 DIAGNOSIS — L97811 Non-pressure chronic ulcer of other part of right lower leg limited to breakdown of skin: Secondary | ICD-10-CM | POA: Diagnosis not present

## 2017-06-21 DIAGNOSIS — I872 Venous insufficiency (chronic) (peripheral): Secondary | ICD-10-CM | POA: Diagnosis not present

## 2017-06-21 DIAGNOSIS — E1151 Type 2 diabetes mellitus with diabetic peripheral angiopathy without gangrene: Secondary | ICD-10-CM | POA: Diagnosis not present

## 2017-06-21 DIAGNOSIS — I890 Lymphedema, not elsewhere classified: Secondary | ICD-10-CM | POA: Diagnosis not present

## 2017-06-21 DIAGNOSIS — M6281 Muscle weakness (generalized): Secondary | ICD-10-CM | POA: Diagnosis not present

## 2017-06-21 DIAGNOSIS — L97821 Non-pressure chronic ulcer of other part of left lower leg limited to breakdown of skin: Secondary | ICD-10-CM | POA: Diagnosis not present

## 2017-06-22 DIAGNOSIS — L97811 Non-pressure chronic ulcer of other part of right lower leg limited to breakdown of skin: Secondary | ICD-10-CM | POA: Diagnosis not present

## 2017-06-22 DIAGNOSIS — E1151 Type 2 diabetes mellitus with diabetic peripheral angiopathy without gangrene: Secondary | ICD-10-CM | POA: Diagnosis not present

## 2017-06-22 DIAGNOSIS — M6281 Muscle weakness (generalized): Secondary | ICD-10-CM | POA: Diagnosis not present

## 2017-06-22 DIAGNOSIS — I872 Venous insufficiency (chronic) (peripheral): Secondary | ICD-10-CM | POA: Diagnosis not present

## 2017-06-22 DIAGNOSIS — L97821 Non-pressure chronic ulcer of other part of left lower leg limited to breakdown of skin: Secondary | ICD-10-CM | POA: Diagnosis not present

## 2017-06-22 DIAGNOSIS — I890 Lymphedema, not elsewhere classified: Secondary | ICD-10-CM | POA: Diagnosis not present

## 2017-06-26 ENCOUNTER — Telehealth: Payer: Self-pay | Admitting: Osteopathic Medicine

## 2017-06-26 DIAGNOSIS — M6281 Muscle weakness (generalized): Secondary | ICD-10-CM | POA: Diagnosis not present

## 2017-06-26 DIAGNOSIS — L97821 Non-pressure chronic ulcer of other part of left lower leg limited to breakdown of skin: Secondary | ICD-10-CM | POA: Diagnosis not present

## 2017-06-26 DIAGNOSIS — L97811 Non-pressure chronic ulcer of other part of right lower leg limited to breakdown of skin: Secondary | ICD-10-CM | POA: Diagnosis not present

## 2017-06-26 DIAGNOSIS — I872 Venous insufficiency (chronic) (peripheral): Secondary | ICD-10-CM | POA: Diagnosis not present

## 2017-06-26 DIAGNOSIS — I890 Lymphedema, not elsewhere classified: Secondary | ICD-10-CM | POA: Diagnosis not present

## 2017-06-26 DIAGNOSIS — E1151 Type 2 diabetes mellitus with diabetic peripheral angiopathy without gangrene: Secondary | ICD-10-CM | POA: Diagnosis not present

## 2017-06-26 NOTE — Telephone Encounter (Signed)
Thanks

## 2017-06-26 NOTE — Telephone Encounter (Signed)
Helene Kelp from [facility] called for verbal order for order social work. She reports the Pt's home is not safe, and created an increased risk for falls. She feels social work would be able to assist Pt with this. Verbal order given.

## 2017-06-29 DIAGNOSIS — I890 Lymphedema, not elsewhere classified: Secondary | ICD-10-CM | POA: Diagnosis not present

## 2017-06-29 DIAGNOSIS — L97821 Non-pressure chronic ulcer of other part of left lower leg limited to breakdown of skin: Secondary | ICD-10-CM | POA: Diagnosis not present

## 2017-06-29 DIAGNOSIS — L97811 Non-pressure chronic ulcer of other part of right lower leg limited to breakdown of skin: Secondary | ICD-10-CM | POA: Diagnosis not present

## 2017-06-29 DIAGNOSIS — M6281 Muscle weakness (generalized): Secondary | ICD-10-CM | POA: Diagnosis not present

## 2017-06-29 DIAGNOSIS — E1151 Type 2 diabetes mellitus with diabetic peripheral angiopathy without gangrene: Secondary | ICD-10-CM | POA: Diagnosis not present

## 2017-06-29 DIAGNOSIS — I872 Venous insufficiency (chronic) (peripheral): Secondary | ICD-10-CM | POA: Diagnosis not present

## 2017-06-30 ENCOUNTER — Telehealth: Payer: Self-pay | Admitting: Osteopathic Medicine

## 2017-06-30 NOTE — Telephone Encounter (Signed)
I have completed my portion of the forms for his disability claim. However, he did not sign the first page that authorizes [place] to release these records. If he is able to come by and sign it we should be able to send the paperwork, or we can mail it to him and he can complete the rest and send it himself if he is unable to come to the office. We can also wait until his next appointment when he will be here anyway.

## 2017-06-30 NOTE — Telephone Encounter (Signed)
Left detailed VM for Pt, callback information provided.

## 2017-07-03 DIAGNOSIS — L97821 Non-pressure chronic ulcer of other part of left lower leg limited to breakdown of skin: Secondary | ICD-10-CM | POA: Diagnosis not present

## 2017-07-03 DIAGNOSIS — E1151 Type 2 diabetes mellitus with diabetic peripheral angiopathy without gangrene: Secondary | ICD-10-CM | POA: Diagnosis not present

## 2017-07-03 DIAGNOSIS — I890 Lymphedema, not elsewhere classified: Secondary | ICD-10-CM | POA: Diagnosis not present

## 2017-07-03 DIAGNOSIS — M6281 Muscle weakness (generalized): Secondary | ICD-10-CM | POA: Diagnosis not present

## 2017-07-03 DIAGNOSIS — L97811 Non-pressure chronic ulcer of other part of right lower leg limited to breakdown of skin: Secondary | ICD-10-CM | POA: Diagnosis not present

## 2017-07-03 DIAGNOSIS — I872 Venous insufficiency (chronic) (peripheral): Secondary | ICD-10-CM | POA: Diagnosis not present

## 2017-07-06 DIAGNOSIS — I872 Venous insufficiency (chronic) (peripheral): Secondary | ICD-10-CM | POA: Diagnosis not present

## 2017-07-06 DIAGNOSIS — E1151 Type 2 diabetes mellitus with diabetic peripheral angiopathy without gangrene: Secondary | ICD-10-CM | POA: Diagnosis not present

## 2017-07-06 DIAGNOSIS — I890 Lymphedema, not elsewhere classified: Secondary | ICD-10-CM | POA: Diagnosis not present

## 2017-07-06 DIAGNOSIS — M6281 Muscle weakness (generalized): Secondary | ICD-10-CM | POA: Diagnosis not present

## 2017-07-06 DIAGNOSIS — L97821 Non-pressure chronic ulcer of other part of left lower leg limited to breakdown of skin: Secondary | ICD-10-CM | POA: Diagnosis not present

## 2017-07-06 DIAGNOSIS — L97811 Non-pressure chronic ulcer of other part of right lower leg limited to breakdown of skin: Secondary | ICD-10-CM | POA: Diagnosis not present

## 2017-07-10 DIAGNOSIS — I872 Venous insufficiency (chronic) (peripheral): Secondary | ICD-10-CM | POA: Diagnosis not present

## 2017-07-10 DIAGNOSIS — I890 Lymphedema, not elsewhere classified: Secondary | ICD-10-CM | POA: Diagnosis not present

## 2017-07-10 DIAGNOSIS — L97921 Non-pressure chronic ulcer of unspecified part of left lower leg limited to breakdown of skin: Secondary | ICD-10-CM | POA: Diagnosis not present

## 2017-07-10 DIAGNOSIS — I10 Essential (primary) hypertension: Secondary | ICD-10-CM | POA: Diagnosis not present

## 2017-07-10 DIAGNOSIS — I739 Peripheral vascular disease, unspecified: Secondary | ICD-10-CM | POA: Diagnosis not present

## 2017-07-10 DIAGNOSIS — I255 Ischemic cardiomyopathy: Secondary | ICD-10-CM | POA: Diagnosis not present

## 2017-07-10 DIAGNOSIS — Z515 Encounter for palliative care: Secondary | ICD-10-CM | POA: Diagnosis not present

## 2017-07-11 ENCOUNTER — Encounter: Payer: Self-pay | Admitting: Osteopathic Medicine

## 2017-07-11 ENCOUNTER — Ambulatory Visit (INDEPENDENT_AMBULATORY_CARE_PROVIDER_SITE_OTHER): Payer: Medicare Other | Admitting: Osteopathic Medicine

## 2017-07-11 VITALS — BP 130/76 | HR 94 | Ht 70.0 in | Wt 354.0 lb

## 2017-07-11 DIAGNOSIS — Z794 Long term (current) use of insulin: Secondary | ICD-10-CM

## 2017-07-11 DIAGNOSIS — E1159 Type 2 diabetes mellitus with other circulatory complications: Secondary | ICD-10-CM | POA: Diagnosis not present

## 2017-07-11 DIAGNOSIS — R5381 Other malaise: Secondary | ICD-10-CM | POA: Diagnosis not present

## 2017-07-11 DIAGNOSIS — I420 Dilated cardiomyopathy: Secondary | ICD-10-CM | POA: Diagnosis not present

## 2017-07-11 DIAGNOSIS — M48062 Spinal stenosis, lumbar region with neurogenic claudication: Secondary | ICD-10-CM | POA: Diagnosis not present

## 2017-07-11 DIAGNOSIS — E1165 Type 2 diabetes mellitus with hyperglycemia: Secondary | ICD-10-CM

## 2017-07-11 DIAGNOSIS — G894 Chronic pain syndrome: Secondary | ICD-10-CM | POA: Diagnosis not present

## 2017-07-11 DIAGNOSIS — IMO0002 Reserved for concepts with insufficient information to code with codable children: Secondary | ICD-10-CM

## 2017-07-11 DIAGNOSIS — I255 Ischemic cardiomyopathy: Secondary | ICD-10-CM | POA: Diagnosis not present

## 2017-07-11 MED ORDER — MORPHINE SULFATE ER 60 MG PO TBCR: 60.0000 mg | EXTENDED_RELEASE_TABLET | Freq: Two times a day (BID) | ORAL | 0 refills | Status: DC

## 2017-07-11 MED ORDER — OXYCODONE HCL 10 MG PO TABS: 10.0000 mg | ORAL_TABLET | Freq: Two times a day (BID) | ORAL | 0 refills | Status: DC | PRN

## 2017-07-11 MED ORDER — DIAZEPAM 2 MG PO TABS: 2.0000 mg | ORAL_TABLET | Freq: Once | ORAL | 0 refills | Status: AC

## 2017-07-11 NOTE — Progress Notes (Signed)
HPI: Mark Dean is a 51 y.o. male  who presents to [facility] today, 07/11/17,  for chief complaint of:  Chief Complaint  Patient presents with  . Follow-up    chronic pain    Back pain:  Previously following with spine center at [facility], Dr. Atilano Ina. He underwent L2 to pelvis posterior spinal fusion with L4/L5 PLIF in 2013. Since that time, patient has struggled with chronic debilitating back and leg pain, neurogenic claudication type symptoms with burning in and down the legs, severe back pain, limited ambulation. At this point, pain medications are helping him to move around and do things better, more mobile, still having to take breaks (CHF is likely complicated the matter as well in terms of easily fatigued), is able to function a bit more independently.  At this point, opiates have been titrated up about as far as I am willing to take them and I believe he needs to see pain management or neurosurgery for further discussion of treatment options. The long-acting opiates are much more helpful to him and in the short acting ones, his goal is to regain mobility, lose weight, take better care of himself. He has adamantly refused assisted living, wants to maintain his independence. Another obstacle to getting good care for him has been lack of prescription drug coverage and inability to get established with the [place].   On 09/08/14, MR cervical spine. No significant central canal stenosis, mild degenerative disease with minimal disk bulge at L4-L5, L5-S1, at C3-C4, C4-C5. No significant foraminal stenosis.  On 09/08/14, MR thoracic spine. No significant central canal stenosis, mild degenerative changes without significant disk bulge, no foraminal stenosis.  On 09/08/14, MR lumbar spine poor image quality, mild scoliosis noted. No significant central canal stenosis, severe degenerative disk disease, status post fusion L3 through S1, some artifact  from pedicles screws obscuring view. No significant foraminal stenosis noted.   Cardiac: Controlled on current medications, no significant increase in lower extremity swelling, Recent follow-up with cardiology. Not sure when he is due for follow-up for repeat echocardiogram  Wound care: Stable, home health is visiting for changing bandages. Swelling has improved. Patient is following with wound care and is noting significant improvement in leg ulcers   Past medical history, surgical history, social history and family history reviewed.  Patient Active Problem List   Diagnosis Date Noted  . Hypogonadism 04/15/2017  . Atypical atrial flutter ([place]) 02/22/2017  . Ischemic cardiomyopathy 02/22/2017  . S/P coronary artery stent placement 02/22/2017  . Acute bilateral deep vein thrombosis (DVT) of femoral veins ([place]) 02/21/2017  . Acute renal failure with acute cortical necrosis ([place]) 02/21/2017  . Chronic diarrhea 02/17/2017  . Debility 02/17/2017  . Depression, recurrent ([place]) 02/10/2017  . Other chronic pain 02/10/2017  . History of pulmonary embolism 02/10/2017  . Acquired hypothyroidism 02/10/2017  . Uncontrolled type 2 diabetes mellitus with complication, with long-term current use of insulin ([place]) 02/10/2017  . Drug-seeking behavior 04/19/2016  . Cellulitis 04/18/2016  . Bilateral diabetic foot ulcer associated with secondary diabetes mellitus ([place]) 01/25/2016  . Edema extremities 12/07/2015  . QT prolongation 12/07/2015  . MDD (major depressive disorder), recurrent severe, without psychosis ([place]) 11/19/2015  . Dehiscence of surgical wound 11/09/2015  . Delayed surgical wound healing 11/03/2015  . Secondary lymphedema 11/03/2015  . Acute on chronic systolic heart failure ([place]) 10/23/2015  . Volume overload 10/23/2015  . S/P right knee surgery 10/19/2015  . Chronic venous insufficiency 12/23/2014  .  PVD (peripheral vascular disease) ([place]) 12/23/2014  . Onychomycosis due to  dermatophyte 09/12/2013  . Enthesopathy of foot 09/12/2013  . Keratosis 09/12/2013  . Depression, major 09/03/2013  . Generalized anxiety disorder 09/01/2013  . Bilateral leg pain 07/22/2013  . DVT, femoral, chronic ([place]) 07/10/2013  . Stasis dermatitis of both legs 07/10/2013  . Lumbar stenosis 09/21/2012  . Spinal stenosis of lumbar region 09/21/2012  . CAD (coronary artery disease) 09/16/2012  . Essential hypertension 11/21/2011  . Obstructive sleep apnea syndrome in adult 11/21/2011  . Pulmonary embolism ([place]) 11/21/2011  . Heterozygous factor V Leiden mutation ([place]) 11/17/2011  . Lymphedema of lower extremity 10/19/2011  . Atherosclerosis of native coronary artery of native heart without angina pectoris 02/22/2011  . Dilated cardiomyopathy ([place]) 02/22/2011  . Mixed diabetic hyperlipidemia associated with type 2 diabetes mellitus ([place]) 02/22/2011  . Morbid obesity with BMI of 50.0-59.9, adult ([place]) 07/01/2009    Current medication list and allergy/intolerance information reviewed.   Current Outpatient Prescriptions on File Prior to Visit  Medication Sig Dispense Refill  . amiodarone (PACERONE) 200 MG tablet Take 200 mg by mouth daily.    Marland Kitchen aspirin EC 81 MG tablet Take 81 mg by mouth daily.    . blood glucose meter kit and supplies KIT Dispense based on patient and insurance preference. Use up to four times daily as directed. Please include lancets, test strips, control solution. 1 each 99  . carvedilol (COREG) 6.25 MG tablet Take 6.25 mg by mouth daily.    . digoxin (LANOXIN) 0.125 MG tablet Take 125 mcg by mouth every other day.    . docusate sodium (COLACE) 100 MG capsule Take 1 capsule (100 mg total) by mouth 2 (two) times daily. 60 capsule 1  . DULoxetine (CYMBALTA) 60 MG capsule Take 1 capsule (60 mg total) by mouth 2 (two) times daily. 180 capsule 1  . glucose blood test strip Use up to 4 times per day as directed with glucometer. Disp: 100. Refill x99 100 each 99  .  hydrALAZINE (APRESOLINE) 25 MG tablet Take 25 mg by mouth 2 (two) times daily.    . Insulin Glargine (TOUJEO SOLOSTAR) 300 UNIT/ML SOPN Inject 30 Units into the skin daily. Increase by 3 units every 3 days to goal fasting blood sugar 80-130 3 mL 6  . Insulin Pen Needle 29G X 12MM MISC Use with Toujeo pen as directed 100 each 99  . Lancet Device MISC Use up to 4 times per day as directed with glucometer. Disp: 100. Refill x99 100 each 99  . levothyroxine (SYNTHROID, LEVOTHROID) 112 MCG tablet Take 1 tablet (112 mcg total) by mouth daily before breakfast. Plan to repeat labs approx 06/07/17 60 tablet 0  . lisinopril (PRINIVIL,ZESTRIL) 5 MG tablet Take 5 mg by mouth daily.    . metFORMIN (GLUCOPHAGE) 1000 MG tablet Take 1 tablet (1,000 mg total) by mouth 2 (two) times daily with a meal. 180 tablet 1  . morphine (MS CONTIN) 60 MG 12 hr tablet Take 1 tablet (60 mg total) by mouth every 12 (twelve) hours. #60 tablets for 30 days - appointment needed for further refills. DO NOT CRUSH/SPLIT 60 tablet 0  . Oxycodone HCl 10 MG TABS Take 1 tablet (10 mg total) by mouth every 12 (twelve) hours as needed (breakthrough pain). In addition to MS Contin 84 tablet 0  . potassium chloride SA (K-DUR,KLOR-CON) 20 MEQ tablet Take 20 mEq by mouth daily.    . rivaroxaban (XARELTO) 20 MG TABS  tablet Take 1 tablet (20 mg total) by mouth daily. 90 tablet 1  . torsemide (DEMADEX) 100 MG tablet Take 50 mg by mouth daily.      No current facility-administered medications on file prior to visit.    No Known Allergies    Review of Systems:  Constitutional: No recent illness  Cardiac: No  chest pain, No  pressure, No palpitations  Respiratory:  No  shortness of breath.   Musculoskeletal: No new myalgia/arthralgia, worsening of chronic back pain - see HPI  Neurologic: No  weakness, No  Dizziness  Psychiatric: No  concerns with depression, +concerns with anxiety  Exam:  BP 130/76   Pulse 94   Ht '5\' 10"'  (1.778 m)    Wt (!) 354 lb (160.6 kg)   BMI 50.79 kg/m   Constitutional: VS see above. General Appearance: alert, well-developed, well-nourished, NAD  Respiratory: Normal respiratory effort.    Musculoskeletal: Symmetric and independent movement of all extremities  Psychiatric: Normal judgment/insight. Normal mood and affect. Oriented x3. No SI/HI. No thought disorder.       ASSESSMENT/PLAN: Faythe Ghee to continue current pain medications, he seems to be getting some good benefit in terms of function though the pain is not totally resolved. He was unable to get MRI scheduled, he got a call but asked them to call him back since he was in the car, he did not receive a call back and isn't sure who to turn to at this point. He is able to get MRI done here, we'll go ahead and get this scheduled for him. Depending on MRI results, he may benefit from interventional measures for pain whether injections, implants or surgery.  Chronic pain syndrome - Plan: morphine (MS CONTIN) 60 MG 12 hr tablet, DISCONTINUED: Oxycodone HCl 10 MG TABS  Spinal stenosis of lumbar region with neurogenic claudication - Plan: DISCONTINUED: Oxycodone HCl 10 MG TABS  Debility - Disability paperwork completed  Uncontrolled type 2 diabetes mellitus with other circulatory complication, with long-term current use of insulin ([place]) - Toujeo sample provided today  Dilated cardiomyopathy ([place]) - Likely contributing to fatigue, consider follow-up with cardiology sooner  Chronic pain syndrome - On anticoagulation, caution with NSAID. Maximized on Cymbalta. Intolerant to gabapentin and Tylenol.  - Plan: morphine (MS CONTIN) 60 MG 12 hr tablet, DISCONTINUED: Oxycodone HCl 10 MG TABS    Patient Instructions  Plan:  Continue current medications, will need to come see me monthly for refills of these  Will get MRI and follow-up in the office to discuss results and plan based on this - please call [facility] downstairs to schedule this -  Phone: [phone]    Patient has capacity to understand risks versus benefits of these medications versus alternative therapies. Patient has been faithful about keeping appointments, no concerns noted on [place] controlled substance database.  Patient lives alone, no one to administer Narcan if this were to be an issue. Patient is advised on cautious titration up on opiates, risk of respiratory/neurologic depression. Patient accepts these risks including the risk of death and is motivated to do whatever he can to regain function and reduce pain. He is willing to follow-up with neurosurgery or pain management after upcoming MRI to discuss nonpharmacological options for pain control  Follow-up plan: Return in about 4 weeks (around 08/08/2017) for refill medications, dicsuss MRI results - sooner if needed or if we can get MRI sooner .  Visit summary with medication list and pertinent instructions was printed for  patient to review, alert [place] if any changes needed. All questions at time of visit were answered - patient instructed to contact office with any additional concerns. ER/RTC precautions were reviewed with the patient and understanding verbalized.

## 2017-07-11 NOTE — Patient Instructions (Addendum)
Plan:  Continue current medications, will need to come see me monthly for refills of these  Will get MRI and follow-up in the office to discuss results and plan based on this - please call [place] downstairs to schedule this - Phone: [phone]

## 2017-07-13 DIAGNOSIS — E1151 Type 2 diabetes mellitus with diabetic peripheral angiopathy without gangrene: Secondary | ICD-10-CM | POA: Diagnosis not present

## 2017-07-13 DIAGNOSIS — I872 Venous insufficiency (chronic) (peripheral): Secondary | ICD-10-CM | POA: Diagnosis not present

## 2017-07-13 DIAGNOSIS — L97821 Non-pressure chronic ulcer of other part of left lower leg limited to breakdown of skin: Secondary | ICD-10-CM | POA: Diagnosis not present

## 2017-07-13 DIAGNOSIS — M6281 Muscle weakness (generalized): Secondary | ICD-10-CM | POA: Diagnosis not present

## 2017-07-13 DIAGNOSIS — L97811 Non-pressure chronic ulcer of other part of right lower leg limited to breakdown of skin: Secondary | ICD-10-CM | POA: Diagnosis not present

## 2017-07-13 DIAGNOSIS — I890 Lymphedema, not elsewhere classified: Secondary | ICD-10-CM | POA: Diagnosis not present

## 2017-07-17 ENCOUNTER — Ambulatory Visit (INDEPENDENT_AMBULATORY_CARE_PROVIDER_SITE_OTHER): Payer: Medicare Other

## 2017-07-17 DIAGNOSIS — E1151 Type 2 diabetes mellitus with diabetic peripheral angiopathy without gangrene: Secondary | ICD-10-CM | POA: Diagnosis not present

## 2017-07-17 DIAGNOSIS — M5136 Other intervertebral disc degeneration, lumbar region: Secondary | ICD-10-CM

## 2017-07-17 DIAGNOSIS — M48062 Spinal stenosis, lumbar region with neurogenic claudication: Secondary | ICD-10-CM | POA: Diagnosis not present

## 2017-07-17 DIAGNOSIS — M6281 Muscle weakness (generalized): Secondary | ICD-10-CM | POA: Diagnosis not present

## 2017-07-17 DIAGNOSIS — I872 Venous insufficiency (chronic) (peripheral): Secondary | ICD-10-CM | POA: Diagnosis not present

## 2017-07-17 DIAGNOSIS — I890 Lymphedema, not elsewhere classified: Secondary | ICD-10-CM | POA: Diagnosis not present

## 2017-07-17 DIAGNOSIS — L97811 Non-pressure chronic ulcer of other part of right lower leg limited to breakdown of skin: Secondary | ICD-10-CM | POA: Diagnosis not present

## 2017-07-17 DIAGNOSIS — M545 Low back pain: Secondary | ICD-10-CM | POA: Diagnosis not present

## 2017-07-17 DIAGNOSIS — L97821 Non-pressure chronic ulcer of other part of left lower leg limited to breakdown of skin: Secondary | ICD-10-CM | POA: Diagnosis not present

## 2017-07-20 ENCOUNTER — Telehealth: Payer: Self-pay | Admitting: *Deleted

## 2017-07-20 DIAGNOSIS — L97811 Non-pressure chronic ulcer of other part of right lower leg limited to breakdown of skin: Secondary | ICD-10-CM | POA: Diagnosis not present

## 2017-07-20 DIAGNOSIS — E1151 Type 2 diabetes mellitus with diabetic peripheral angiopathy without gangrene: Secondary | ICD-10-CM | POA: Diagnosis not present

## 2017-07-20 DIAGNOSIS — I890 Lymphedema, not elsewhere classified: Secondary | ICD-10-CM | POA: Diagnosis not present

## 2017-07-20 DIAGNOSIS — M6281 Muscle weakness (generalized): Secondary | ICD-10-CM | POA: Diagnosis not present

## 2017-07-20 DIAGNOSIS — L97821 Non-pressure chronic ulcer of other part of left lower leg limited to breakdown of skin: Secondary | ICD-10-CM | POA: Diagnosis not present

## 2017-07-20 DIAGNOSIS — I872 Venous insufficiency (chronic) (peripheral): Secondary | ICD-10-CM | POA: Diagnosis not present

## 2017-07-20 NOTE — Telephone Encounter (Signed)
Helene Kelp from [facility] called and states the patients BP was 180/120 and then 170/110. The patient had not taken his BP medication yet but she states it has never been this high.SHe states the patient said he didn't feel well. 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

## 2017-07-20 NOTE — Telephone Encounter (Signed)
Noted. If not feeling well and BP high they should also contact his cardiologist

## 2017-07-21 NOTE — Telephone Encounter (Signed)
Message left on Mark Dean's vm from [facility]

## 2017-07-22 DIAGNOSIS — L97811 Non-pressure chronic ulcer of other part of right lower leg limited to breakdown of skin: Secondary | ICD-10-CM | POA: Diagnosis not present

## 2017-07-22 DIAGNOSIS — I872 Venous insufficiency (chronic) (peripheral): Secondary | ICD-10-CM | POA: Diagnosis not present

## 2017-07-22 DIAGNOSIS — I890 Lymphedema, not elsewhere classified: Secondary | ICD-10-CM | POA: Diagnosis not present

## 2017-07-22 DIAGNOSIS — M6281 Muscle weakness (generalized): Secondary | ICD-10-CM | POA: Diagnosis not present

## 2017-07-22 DIAGNOSIS — L97821 Non-pressure chronic ulcer of other part of left lower leg limited to breakdown of skin: Secondary | ICD-10-CM | POA: Diagnosis not present

## 2017-07-22 DIAGNOSIS — E1151 Type 2 diabetes mellitus with diabetic peripheral angiopathy without gangrene: Secondary | ICD-10-CM | POA: Diagnosis not present

## 2017-07-23 DIAGNOSIS — E1151 Type 2 diabetes mellitus with diabetic peripheral angiopathy without gangrene: Secondary | ICD-10-CM | POA: Diagnosis not present

## 2017-07-23 DIAGNOSIS — I484 Atypical atrial flutter: Secondary | ICD-10-CM | POA: Diagnosis not present

## 2017-07-23 DIAGNOSIS — E782 Mixed hyperlipidemia: Secondary | ICD-10-CM | POA: Diagnosis not present

## 2017-07-23 DIAGNOSIS — I110 Hypertensive heart disease with heart failure: Secondary | ICD-10-CM | POA: Diagnosis not present

## 2017-07-23 DIAGNOSIS — L97821 Non-pressure chronic ulcer of other part of left lower leg limited to breakdown of skin: Secondary | ICD-10-CM | POA: Diagnosis not present

## 2017-07-23 DIAGNOSIS — E1140 Type 2 diabetes mellitus with diabetic neuropathy, unspecified: Secondary | ICD-10-CM | POA: Diagnosis not present

## 2017-07-23 DIAGNOSIS — Z792 Long term (current) use of antibiotics: Secondary | ICD-10-CM | POA: Diagnosis not present

## 2017-07-23 DIAGNOSIS — F332 Major depressive disorder, recurrent severe without psychotic features: Secondary | ICD-10-CM | POA: Diagnosis not present

## 2017-07-23 DIAGNOSIS — G4733 Obstructive sleep apnea (adult) (pediatric): Secondary | ICD-10-CM | POA: Diagnosis not present

## 2017-07-23 DIAGNOSIS — I5022 Chronic systolic (congestive) heart failure: Secondary | ICD-10-CM | POA: Diagnosis not present

## 2017-07-23 DIAGNOSIS — E1169 Type 2 diabetes mellitus with other specified complication: Secondary | ICD-10-CM | POA: Diagnosis not present

## 2017-07-23 DIAGNOSIS — M48061 Spinal stenosis, lumbar region without neurogenic claudication: Secondary | ICD-10-CM | POA: Diagnosis not present

## 2017-07-23 DIAGNOSIS — I872 Venous insufficiency (chronic) (peripheral): Secondary | ICD-10-CM | POA: Diagnosis not present

## 2017-07-23 DIAGNOSIS — I255 Ischemic cardiomyopathy: Secondary | ICD-10-CM | POA: Diagnosis not present

## 2017-07-23 DIAGNOSIS — I420 Dilated cardiomyopathy: Secondary | ICD-10-CM | POA: Diagnosis not present

## 2017-07-23 DIAGNOSIS — G8929 Other chronic pain: Secondary | ICD-10-CM | POA: Diagnosis not present

## 2017-07-23 DIAGNOSIS — L97311 Non-pressure chronic ulcer of right ankle limited to breakdown of skin: Secondary | ICD-10-CM | POA: Diagnosis not present

## 2017-07-23 DIAGNOSIS — I890 Lymphedema, not elsewhere classified: Secondary | ICD-10-CM | POA: Diagnosis not present

## 2017-07-23 DIAGNOSIS — L97811 Non-pressure chronic ulcer of other part of right lower leg limited to breakdown of skin: Secondary | ICD-10-CM | POA: Diagnosis not present

## 2017-07-23 DIAGNOSIS — Z7901 Long term (current) use of anticoagulants: Secondary | ICD-10-CM | POA: Diagnosis not present

## 2017-07-23 DIAGNOSIS — I2510 Atherosclerotic heart disease of native coronary artery without angina pectoris: Secondary | ICD-10-CM | POA: Diagnosis not present

## 2017-07-23 DIAGNOSIS — F411 Generalized anxiety disorder: Secondary | ICD-10-CM | POA: Diagnosis not present

## 2017-07-23 DIAGNOSIS — L97211 Non-pressure chronic ulcer of right calf limited to breakdown of skin: Secondary | ICD-10-CM | POA: Diagnosis not present

## 2017-07-23 DIAGNOSIS — Z7982 Long term (current) use of aspirin: Secondary | ICD-10-CM | POA: Diagnosis not present

## 2017-07-24 DIAGNOSIS — L97211 Non-pressure chronic ulcer of right calf limited to breakdown of skin: Secondary | ICD-10-CM | POA: Diagnosis not present

## 2017-07-24 DIAGNOSIS — L97821 Non-pressure chronic ulcer of other part of left lower leg limited to breakdown of skin: Secondary | ICD-10-CM | POA: Diagnosis not present

## 2017-07-24 DIAGNOSIS — L97311 Non-pressure chronic ulcer of right ankle limited to breakdown of skin: Secondary | ICD-10-CM | POA: Diagnosis not present

## 2017-07-24 DIAGNOSIS — I872 Venous insufficiency (chronic) (peripheral): Secondary | ICD-10-CM | POA: Diagnosis not present

## 2017-07-24 DIAGNOSIS — L97811 Non-pressure chronic ulcer of other part of right lower leg limited to breakdown of skin: Secondary | ICD-10-CM | POA: Diagnosis not present

## 2017-07-24 DIAGNOSIS — E1151 Type 2 diabetes mellitus with diabetic peripheral angiopathy without gangrene: Secondary | ICD-10-CM | POA: Diagnosis not present

## 2017-07-26 DIAGNOSIS — L97821 Non-pressure chronic ulcer of other part of left lower leg limited to breakdown of skin: Secondary | ICD-10-CM | POA: Diagnosis not present

## 2017-07-26 DIAGNOSIS — I872 Venous insufficiency (chronic) (peripheral): Secondary | ICD-10-CM | POA: Diagnosis not present

## 2017-07-26 DIAGNOSIS — L97211 Non-pressure chronic ulcer of right calf limited to breakdown of skin: Secondary | ICD-10-CM | POA: Diagnosis not present

## 2017-07-26 DIAGNOSIS — E1151 Type 2 diabetes mellitus with diabetic peripheral angiopathy without gangrene: Secondary | ICD-10-CM | POA: Diagnosis not present

## 2017-07-26 DIAGNOSIS — L97811 Non-pressure chronic ulcer of other part of right lower leg limited to breakdown of skin: Secondary | ICD-10-CM | POA: Diagnosis not present

## 2017-07-26 DIAGNOSIS — L97311 Non-pressure chronic ulcer of right ankle limited to breakdown of skin: Secondary | ICD-10-CM | POA: Diagnosis not present

## 2017-07-28 DIAGNOSIS — E1151 Type 2 diabetes mellitus with diabetic peripheral angiopathy without gangrene: Secondary | ICD-10-CM | POA: Diagnosis not present

## 2017-07-28 DIAGNOSIS — L97821 Non-pressure chronic ulcer of other part of left lower leg limited to breakdown of skin: Secondary | ICD-10-CM | POA: Diagnosis not present

## 2017-07-28 DIAGNOSIS — L97211 Non-pressure chronic ulcer of right calf limited to breakdown of skin: Secondary | ICD-10-CM | POA: Diagnosis not present

## 2017-07-28 DIAGNOSIS — L97311 Non-pressure chronic ulcer of right ankle limited to breakdown of skin: Secondary | ICD-10-CM | POA: Diagnosis not present

## 2017-07-28 DIAGNOSIS — I872 Venous insufficiency (chronic) (peripheral): Secondary | ICD-10-CM | POA: Diagnosis not present

## 2017-07-28 DIAGNOSIS — L97811 Non-pressure chronic ulcer of other part of right lower leg limited to breakdown of skin: Secondary | ICD-10-CM | POA: Diagnosis not present

## 2017-08-01 DIAGNOSIS — L97811 Non-pressure chronic ulcer of other part of right lower leg limited to breakdown of skin: Secondary | ICD-10-CM | POA: Diagnosis not present

## 2017-08-01 DIAGNOSIS — L97311 Non-pressure chronic ulcer of right ankle limited to breakdown of skin: Secondary | ICD-10-CM | POA: Diagnosis not present

## 2017-08-01 DIAGNOSIS — I872 Venous insufficiency (chronic) (peripheral): Secondary | ICD-10-CM | POA: Diagnosis not present

## 2017-08-01 DIAGNOSIS — L97821 Non-pressure chronic ulcer of other part of left lower leg limited to breakdown of skin: Secondary | ICD-10-CM | POA: Diagnosis not present

## 2017-08-01 DIAGNOSIS — E1151 Type 2 diabetes mellitus with diabetic peripheral angiopathy without gangrene: Secondary | ICD-10-CM | POA: Diagnosis not present

## 2017-08-01 DIAGNOSIS — L97211 Non-pressure chronic ulcer of right calf limited to breakdown of skin: Secondary | ICD-10-CM | POA: Diagnosis not present

## 2017-08-04 DIAGNOSIS — E1151 Type 2 diabetes mellitus with diabetic peripheral angiopathy without gangrene: Secondary | ICD-10-CM | POA: Diagnosis not present

## 2017-08-04 DIAGNOSIS — L97821 Non-pressure chronic ulcer of other part of left lower leg limited to breakdown of skin: Secondary | ICD-10-CM | POA: Diagnosis not present

## 2017-08-04 DIAGNOSIS — L97811 Non-pressure chronic ulcer of other part of right lower leg limited to breakdown of skin: Secondary | ICD-10-CM | POA: Diagnosis not present

## 2017-08-04 DIAGNOSIS — I872 Venous insufficiency (chronic) (peripheral): Secondary | ICD-10-CM | POA: Diagnosis not present

## 2017-08-04 DIAGNOSIS — L97311 Non-pressure chronic ulcer of right ankle limited to breakdown of skin: Secondary | ICD-10-CM | POA: Diagnosis not present

## 2017-08-04 DIAGNOSIS — L97211 Non-pressure chronic ulcer of right calf limited to breakdown of skin: Secondary | ICD-10-CM | POA: Diagnosis not present

## 2017-08-08 DIAGNOSIS — I872 Venous insufficiency (chronic) (peripheral): Secondary | ICD-10-CM | POA: Diagnosis not present

## 2017-08-08 DIAGNOSIS — L97211 Non-pressure chronic ulcer of right calf limited to breakdown of skin: Secondary | ICD-10-CM | POA: Diagnosis not present

## 2017-08-08 DIAGNOSIS — E1151 Type 2 diabetes mellitus with diabetic peripheral angiopathy without gangrene: Secondary | ICD-10-CM | POA: Diagnosis not present

## 2017-08-08 DIAGNOSIS — L97811 Non-pressure chronic ulcer of other part of right lower leg limited to breakdown of skin: Secondary | ICD-10-CM | POA: Diagnosis not present

## 2017-08-08 DIAGNOSIS — L97311 Non-pressure chronic ulcer of right ankle limited to breakdown of skin: Secondary | ICD-10-CM | POA: Diagnosis not present

## 2017-08-08 DIAGNOSIS — L97821 Non-pressure chronic ulcer of other part of left lower leg limited to breakdown of skin: Secondary | ICD-10-CM | POA: Diagnosis not present

## 2017-08-09 DIAGNOSIS — L97311 Non-pressure chronic ulcer of right ankle limited to breakdown of skin: Secondary | ICD-10-CM | POA: Diagnosis not present

## 2017-08-09 DIAGNOSIS — I872 Venous insufficiency (chronic) (peripheral): Secondary | ICD-10-CM | POA: Diagnosis not present

## 2017-08-09 DIAGNOSIS — L97211 Non-pressure chronic ulcer of right calf limited to breakdown of skin: Secondary | ICD-10-CM | POA: Diagnosis not present

## 2017-08-09 DIAGNOSIS — L97811 Non-pressure chronic ulcer of other part of right lower leg limited to breakdown of skin: Secondary | ICD-10-CM | POA: Diagnosis not present

## 2017-08-10 ENCOUNTER — Ambulatory Visit (INDEPENDENT_AMBULATORY_CARE_PROVIDER_SITE_OTHER): Payer: Medicare Other | Admitting: Osteopathic Medicine

## 2017-08-10 ENCOUNTER — Encounter: Payer: Self-pay | Admitting: Osteopathic Medicine

## 2017-08-10 VITALS — BP 135/81 | HR 98

## 2017-08-10 DIAGNOSIS — I872 Venous insufficiency (chronic) (peripheral): Secondary | ICD-10-CM | POA: Diagnosis not present

## 2017-08-10 DIAGNOSIS — R52 Pain, unspecified: Secondary | ICD-10-CM

## 2017-08-10 DIAGNOSIS — Z6841 Body Mass Index (BMI) 40.0 and over, adult: Secondary | ICD-10-CM | POA: Diagnosis not present

## 2017-08-10 DIAGNOSIS — R5381 Other malaise: Secondary | ICD-10-CM | POA: Diagnosis not present

## 2017-08-10 DIAGNOSIS — G894 Chronic pain syndrome: Secondary | ICD-10-CM

## 2017-08-10 DIAGNOSIS — E119 Type 2 diabetes mellitus without complications: Secondary | ICD-10-CM | POA: Diagnosis not present

## 2017-08-10 DIAGNOSIS — M48062 Spinal stenosis, lumbar region with neurogenic claudication: Secondary | ICD-10-CM | POA: Diagnosis not present

## 2017-08-10 DIAGNOSIS — I255 Ischemic cardiomyopathy: Secondary | ICD-10-CM | POA: Diagnosis not present

## 2017-08-10 LAB — POCT GLYCOSYLATED HEMOGLOBIN (HGB A1C): Hemoglobin A1C: 7.1

## 2017-08-10 MED ORDER — MORPHINE SULFATE ER 60 MG PO TBCR: 60.0000 mg | EXTENDED_RELEASE_TABLET | Freq: Two times a day (BID) | ORAL | 0 refills | Status: DC

## 2017-08-10 MED ORDER — OXYCODONE HCL 10 MG PO TABS: 10.0000 mg | ORAL_TABLET | Freq: Two times a day (BID) | ORAL | 0 refills | Status: DC | PRN

## 2017-08-10 NOTE — Patient Instructions (Addendum)
Plan:  3 months refills of pain medicines provided - DO NOT LOSE these prescriptions as we should not write additional ones per clinic policy and per your controlled substance agreement. If any problems, please call us or schedule a visit to discuss. Otherwise, keep taking as you've been!   Will refer to Dr. Evelene Croon for assistance with pain management and he can review MRI results, discuss options. Bring your disc with you to that consultation.

## 2017-08-10 NOTE — Progress Notes (Signed)
HPI: Mark Dean is a 51 y.o. male  who presents to [facility] today, 08/10/17,  for chief complaint of:  Chief Complaint  Patient presents with  . Follow-up    medication refills and MRI    Back pain:  Previously following with spine center at [facility], Dr. Atilano Ina. He underwent L2 to pelvis posterior spinal fusion with L4/L5 PLIF in 2013. Since that time, patient has struggled with chronic debilitating back and leg pain, neurogenic claudication type symptoms with burning in and down the legs, severe back pain, limited ambulation. At this point, pain medications are helping him to move around and do things better, more mobile, still having to take breaks (CHF has likely complicated the matter as well in terms of easily fatigued), is able to function a bit more independently.  At this point, opiates have been titrated up about as far as the patient and I are willing to take them and I believe he needs to see pain management or neurosurgery for further discussion of treatment options. The long-acting opiates are much more helpful to him and in the short acting ones, his goal is to regain mobility, lose weight, take better care of himself.   Goals/Values: He has adamantly refused assisted living, wants to maintain his independence. He is very reluctant to pursue overly invasive surgical measures but is open to other procedures/interventions  Another obstacle to getting good care for him has been lack of prescription drug coverage and inability to get established with the [place].   On 09/08/14, MR cervical spine. No significant central canal stenosis, mild degenerative disease with minimal disk bulge at L4-L5, L5-S1, at C3-C4, C4-C5. No significant foraminal stenosis.  On 09/08/14, MR thoracic spine. No significant central canal stenosis, mild degenerative changes without significant disk bulge, no foraminal stenosis.  On 09/08/14, MR lumbar spine  poor image quality, mild scoliosis noted. No significant central canal stenosis, severe degenerative disk disease, status post fusion L3 through S1, some artifact from pedicles screws obscuring view. No significant foraminal stenosis noted.  Most recent MRI with [facility] 07/17/17:  FINDINGS: Segmentation:  Standard. Alignment: Grade 1 retrolisthesis at L1-L2 and L2-L3. Grade 1 anterolisthesis at L4-L5. Chronic compression deformity of T11 with approximately 25% anterior height loss. Vertebrae:  Posterior spinal fusion extends from L3-S1. Conus medullaris: Extends to the T12 level and appears normal. Paraspinal and other soft tissues: The visualized aorta, IVC and iliac vessels are normal. The visualized retroperitoneal organs and paraspinal soft tissues are normal. Disc levels: T11-T12:: Minimal disc bulge.  No stenosis. T12-L1:  Small disc bulge.  No stenosis. L1-L2: Disc desiccation with mild bulge and moderate bilateral facet hypertrophy. Moderate right and severe left neural foraminal stenosis. No spinal canal stenosis. L2-L3: Disc desiccation with minimal bulge.  No stenosis. L3-L4: Postfusion changes with wide patency of the thecal sac. No foraminal stenosis. L4-L5: Postfusion changes with wide patency of the thecal sac. Small amount of fluid dorsal to the spinal canal. Visualization neural foramina limited by susceptibility artifact from spinal hardware. L5-S1: Postfusion changes. No spinal canal or neural foraminal stenosis. Visualized sacrum: Normal. IMPRESSION: 1. L3-S1 PLIF without spinal canal stenosis at fused levels. 2. Moderate right and severe left neural foraminal stenosis at L1-L2 due to combination of disc disease and facet arthrosis. 3. Poor visualization of the L4-L5 neural foramina due to susceptibility artifact of spinal hardware. There is some degree of neural foraminal stenosis at this level, but visualization is too poor to  quantify it  accurately.   Cardiac: Controlled on current medications, no significant increase in lower extremity swelling, Recent follow-up with cardiology.   Wound care: Stable, home health is visiting for changing bandages, he is following with wound care and blisters and swelling has improved.  Diabetes: A1c shows some improvement, getting coverage for prescriptions has still been a challenge for him, he should be getting some coverage in October of this year. Fairly compliant with low carb diet. Unable to exercise.   Past medical history, surgical history, social history and family history reviewed.  Patient Active Problem List   Diagnosis Date Noted  . Hypogonadism 04/15/2017  . Atypical atrial flutter ([place]) 02/22/2017  . Ischemic cardiomyopathy 02/22/2017  . S/P coronary artery stent placement 02/22/2017  . Acute bilateral deep vein thrombosis (DVT) of femoral veins ([place]) 02/21/2017  . Acute renal failure with acute cortical necrosis ([place]) 02/21/2017  . Chronic diarrhea 02/17/2017  . Debility 02/17/2017  . Depression, recurrent ([place]) 02/10/2017  . Other chronic pain 02/10/2017  . History of pulmonary embolism 02/10/2017  . Acquired hypothyroidism 02/10/2017  . Uncontrolled type 2 diabetes mellitus with complication, with long-term current use of insulin ([place]) 02/10/2017  . Drug-seeking behavior 04/19/2016  . Cellulitis 04/18/2016  . Bilateral diabetic foot ulcer associated with secondary diabetes mellitus ([place]) 01/25/2016  . Edema extremities 12/07/2015  . QT prolongation 12/07/2015  . MDD (major depressive disorder), recurrent severe, without psychosis ([place]) 11/19/2015  . Dehiscence of surgical wound 11/09/2015  . Delayed surgical wound healing 11/03/2015  . Secondary lymphedema 11/03/2015  . Acute on chronic systolic heart failure ([place]) 10/23/2015  . Volume overload 10/23/2015  . S/P right knee surgery 10/19/2015  . Chronic venous insufficiency 12/23/2014  . PVD (peripheral  vascular disease) ([place]) 12/23/2014  . Onychomycosis due to dermatophyte 09/12/2013  . Enthesopathy of foot 09/12/2013  . Keratosis 09/12/2013  . Depression, major 09/03/2013  . Generalized anxiety disorder 09/01/2013  . Bilateral leg pain 07/22/2013  . DVT, femoral, chronic ([place]) 07/10/2013  . Stasis dermatitis of both legs 07/10/2013  . Lumbar stenosis 09/21/2012  . Spinal stenosis of lumbar region 09/21/2012  . CAD (coronary artery disease) 09/16/2012  . Essential hypertension 11/21/2011  . Obstructive sleep apnea syndrome in adult 11/21/2011  . Pulmonary embolism ([place]) 11/21/2011  . Heterozygous factor V Leiden mutation ([place]) 11/17/2011  . Lymphedema of lower extremity 10/19/2011  . Atherosclerosis of native coronary artery of native heart without angina pectoris 02/22/2011  . Dilated cardiomyopathy ([place]) 02/22/2011  . Mixed diabetic hyperlipidemia associated with type 2 diabetes mellitus ([place]) 02/22/2011  . Morbid obesity with BMI of 50.0-59.9, adult ([place]) 07/01/2009    Current medication list and allergy/intolerance information reviewed.   Current Outpatient Prescriptions on File Prior to Visit  Medication Sig Dispense Refill  . amiodarone (PACERONE) 200 MG tablet Take 200 mg by mouth daily.    Marland Kitchen aspirin EC 81 MG tablet Take 81 mg by mouth daily.    . blood glucose meter kit and supplies KIT Dispense based on patient and insurance preference. Use up to four times daily as directed. Please include lancets, test strips, control solution. 1 each 99  . carvedilol (COREG) 6.25 MG tablet Take 6.25 mg by mouth daily.    . digoxin (LANOXIN) 0.125 MG tablet Take 125 mcg by mouth every other day.    . docusate sodium (COLACE) 100 MG capsule Take 1 capsule (100 mg total) by mouth 2 (two) times daily. 60 capsule 1  . DULoxetine (  CYMBALTA) 60 MG capsule Take 1 capsule (60 mg total) by mouth 2 (two) times daily. 180 capsule 1  . glucose blood test strip Use up to 4 times per day as directed  with glucometer. Disp: 100. Refill x99 100 each 99  . hydrALAZINE (APRESOLINE) 25 MG tablet Take 25 mg by mouth 2 (two) times daily.    . Insulin Glargine (TOUJEO SOLOSTAR) 300 UNIT/ML SOPN Inject 30 Units into the skin daily. Increase by 3 units every 3 days to goal fasting blood sugar 80-130 3 mL 6  . Insulin Pen Needle 29G X 12MM MISC Use with Toujeo pen as directed 100 each 99  . Lancet Device MISC Use up to 4 times per day as directed with glucometer. Disp: 100. Refill x99 100 each 99  . levothyroxine (SYNTHROID, LEVOTHROID) 112 MCG tablet Take 1 tablet (112 mcg total) by mouth daily before breakfast. Plan to repeat labs approx 06/07/17 60 tablet 0  . lisinopril (PRINIVIL,ZESTRIL) 5 MG tablet Take 5 mg by mouth daily.    . metFORMIN (GLUCOPHAGE) 1000 MG tablet Take 1 tablet (1,000 mg total) by mouth 2 (two) times daily with a meal. 180 tablet 1  . morphine (MS CONTIN) 60 MG 12 hr tablet Take 1 tablet (60 mg total) by mouth every 12 (twelve) hours. #60 tablets for 30 days - appointment needed for further refills. DO NOT CRUSH/SPLIT 60 tablet 0  . Oxycodone HCl 10 MG TABS Take 1 tablet (10 mg total) by mouth every 12 (twelve) hours as needed (breakthrough pain). In addition to MS Contin 60 tablet 0  . potassium chloride SA (K-DUR,KLOR-CON) 20 MEQ tablet Take 20 mEq by mouth daily.    Marland Kitchen torsemide (DEMADEX) 100 MG tablet Take 50 mg by mouth daily.      No current facility-administered medications on file prior to visit.    No Known Allergies    Review of Systems:  Constitutional: No recent illness  Cardiac: No  chest pain, No  pressure, No palpitations  Respiratory:  No  shortness of breath.   Musculoskeletal: No new myalgia/arthralgia, worsening of chronic back pain - see HPI  Neurologic: No  weakness, No  Dizziness  Psychiatric: No  concerns with depression, +concerns with anxiety  Exam:  BP 135/81   Pulse 98   Constitutional: VS see above. General Appearance: alert,  well-developed, well-nourished, NAD  Respiratory: Normal respiratory effort.    Musculoskeletal: Symmetric and independent movement of all extremities  Psychiatric: Normal judgment/insight. Normal mood and affect. Oriented x3. No SI/HI. No thought disorder.    Results for orders placed or performed in visit on 08/10/17 (from the past 24 hour(s))  POCT HgB A1C     Status: None   Collection Time: 08/10/17 11:20 AM  Result Value Ref Range   Hemoglobin A1C 7.1       ASSESSMENT/PLAN: Okay to continue current pain medications, he seems to be getting some good benefit in terms of function though the pain is not totally resolved - he is realistic about medications not totally taking away the pain, he is pleased with progress toward his goals of overall mobility and able to care for himself better.   Chronic pain syndrome - On anticoagulation, caution with NSAID. Maximized on Cymbalta. Intolerant to gabapentin and Tylenol.  - Plan: morphine (MS CONTIN) 60 MG 12 hr tablet, DISCONTINUED: morphine (MS CONTIN) 60 MG 12 hr tablet, DISCONTINUED: morphine (MS CONTIN) 60 MG 12 hr tablet  Diabetes mellitus without complication (HCC) -  Plan: POCT HgB A1C  Stasis dermatitis of both legs  Spinal stenosis of lumbar region with neurogenic claudication  Morbid obesity with BMI of 50.0-59.9, adult ([place])  Debility    Patient Instructions  Plan:  3 months refills of pain medicines provided - DO NOT LOSE these prescriptions as we should not write additional ones per clinic policy and per your controlled substance agreement. If any problems, please call us or schedule a visit to discuss. Otherwise, keep taking as you've been!   Will refer to Dr. Evelene Croon for assistance with pain management and he can review MRI results, discuss options. Bring your disc with you to that consultation.       Patient has capacity to understand risks versus benefits of these medications versus alternative therapies. Patient  has been faithful about keeping appointments, no concerns noted on [place] controlled substance database.  Patient lives alone, no one to administer Narcan if this were to be an issue. Patient is advised on risk of respiratory/neurologic depression. Patient accepts these risks including the risk of death and is motivated to do whatever he can to regain function and reduce pain. He is willing to follow-up with neurosurgery or pain management after upcoming MRI to discuss nonpharmacological options for pain control  Follow-up plan: Return in about 3 months (around 11/10/2017) for refill pain medications and recheck sugars, sooner if needed.  Visit summary with medication list and pertinent instructions was printed for patient to review, alert [place] if any changes needed. All questions at time of visit were answered - patient instructed to contact office with any additional concerns. ER/RTC precautions were reviewed with the patient and understanding verbalized.

## 2017-08-11 DIAGNOSIS — E1151 Type 2 diabetes mellitus with diabetic peripheral angiopathy without gangrene: Secondary | ICD-10-CM | POA: Diagnosis not present

## 2017-08-11 DIAGNOSIS — R52 Pain, unspecified: Secondary | ICD-10-CM | POA: Insufficient documentation

## 2017-08-11 DIAGNOSIS — I872 Venous insufficiency (chronic) (peripheral): Secondary | ICD-10-CM | POA: Diagnosis not present

## 2017-08-11 DIAGNOSIS — L97311 Non-pressure chronic ulcer of right ankle limited to breakdown of skin: Secondary | ICD-10-CM | POA: Diagnosis not present

## 2017-08-11 DIAGNOSIS — L97211 Non-pressure chronic ulcer of right calf limited to breakdown of skin: Secondary | ICD-10-CM | POA: Diagnosis not present

## 2017-08-11 DIAGNOSIS — L97811 Non-pressure chronic ulcer of other part of right lower leg limited to breakdown of skin: Secondary | ICD-10-CM | POA: Diagnosis not present

## 2017-08-11 DIAGNOSIS — L97821 Non-pressure chronic ulcer of other part of left lower leg limited to breakdown of skin: Secondary | ICD-10-CM | POA: Diagnosis not present

## 2017-08-14 DIAGNOSIS — I739 Peripheral vascular disease, unspecified: Secondary | ICD-10-CM | POA: Diagnosis not present

## 2017-08-14 DIAGNOSIS — Z515 Encounter for palliative care: Secondary | ICD-10-CM | POA: Diagnosis not present

## 2017-08-14 DIAGNOSIS — I255 Ischemic cardiomyopathy: Secondary | ICD-10-CM | POA: Diagnosis not present

## 2017-08-14 DIAGNOSIS — I872 Venous insufficiency (chronic) (peripheral): Secondary | ICD-10-CM | POA: Diagnosis not present

## 2017-08-14 DIAGNOSIS — L97921 Non-pressure chronic ulcer of unspecified part of left lower leg limited to breakdown of skin: Secondary | ICD-10-CM | POA: Diagnosis not present

## 2017-08-14 DIAGNOSIS — I890 Lymphedema, not elsewhere classified: Secondary | ICD-10-CM | POA: Diagnosis not present

## 2017-08-14 DIAGNOSIS — I10 Essential (primary) hypertension: Secondary | ICD-10-CM | POA: Diagnosis not present

## 2017-08-15 DIAGNOSIS — L97811 Non-pressure chronic ulcer of other part of right lower leg limited to breakdown of skin: Secondary | ICD-10-CM | POA: Diagnosis not present

## 2017-08-15 DIAGNOSIS — L97211 Non-pressure chronic ulcer of right calf limited to breakdown of skin: Secondary | ICD-10-CM | POA: Diagnosis not present

## 2017-08-15 DIAGNOSIS — L97821 Non-pressure chronic ulcer of other part of left lower leg limited to breakdown of skin: Secondary | ICD-10-CM | POA: Diagnosis not present

## 2017-08-15 DIAGNOSIS — I872 Venous insufficiency (chronic) (peripheral): Secondary | ICD-10-CM | POA: Diagnosis not present

## 2017-08-15 DIAGNOSIS — E1151 Type 2 diabetes mellitus with diabetic peripheral angiopathy without gangrene: Secondary | ICD-10-CM | POA: Diagnosis not present

## 2017-08-15 DIAGNOSIS — L97311 Non-pressure chronic ulcer of right ankle limited to breakdown of skin: Secondary | ICD-10-CM | POA: Diagnosis not present

## 2017-08-18 DIAGNOSIS — I872 Venous insufficiency (chronic) (peripheral): Secondary | ICD-10-CM | POA: Diagnosis not present

## 2017-08-18 DIAGNOSIS — L97821 Non-pressure chronic ulcer of other part of left lower leg limited to breakdown of skin: Secondary | ICD-10-CM | POA: Diagnosis not present

## 2017-08-18 DIAGNOSIS — L97811 Non-pressure chronic ulcer of other part of right lower leg limited to breakdown of skin: Secondary | ICD-10-CM | POA: Diagnosis not present

## 2017-08-18 DIAGNOSIS — L97311 Non-pressure chronic ulcer of right ankle limited to breakdown of skin: Secondary | ICD-10-CM | POA: Diagnosis not present

## 2017-08-18 DIAGNOSIS — E1151 Type 2 diabetes mellitus with diabetic peripheral angiopathy without gangrene: Secondary | ICD-10-CM | POA: Diagnosis not present

## 2017-08-18 DIAGNOSIS — L97211 Non-pressure chronic ulcer of right calf limited to breakdown of skin: Secondary | ICD-10-CM | POA: Diagnosis not present

## 2017-08-21 DIAGNOSIS — L97311 Non-pressure chronic ulcer of right ankle limited to breakdown of skin: Secondary | ICD-10-CM | POA: Diagnosis not present

## 2017-08-21 DIAGNOSIS — I872 Venous insufficiency (chronic) (peripheral): Secondary | ICD-10-CM | POA: Diagnosis not present

## 2017-08-21 DIAGNOSIS — E1151 Type 2 diabetes mellitus with diabetic peripheral angiopathy without gangrene: Secondary | ICD-10-CM | POA: Diagnosis not present

## 2017-08-21 DIAGNOSIS — L97811 Non-pressure chronic ulcer of other part of right lower leg limited to breakdown of skin: Secondary | ICD-10-CM | POA: Diagnosis not present

## 2017-08-21 DIAGNOSIS — L97211 Non-pressure chronic ulcer of right calf limited to breakdown of skin: Secondary | ICD-10-CM | POA: Diagnosis not present

## 2017-08-21 DIAGNOSIS — L97821 Non-pressure chronic ulcer of other part of left lower leg limited to breakdown of skin: Secondary | ICD-10-CM | POA: Diagnosis not present

## 2017-08-25 DIAGNOSIS — L97211 Non-pressure chronic ulcer of right calf limited to breakdown of skin: Secondary | ICD-10-CM | POA: Diagnosis not present

## 2017-08-25 DIAGNOSIS — L97311 Non-pressure chronic ulcer of right ankle limited to breakdown of skin: Secondary | ICD-10-CM | POA: Diagnosis not present

## 2017-08-25 DIAGNOSIS — E1151 Type 2 diabetes mellitus with diabetic peripheral angiopathy without gangrene: Secondary | ICD-10-CM | POA: Diagnosis not present

## 2017-08-25 DIAGNOSIS — L97821 Non-pressure chronic ulcer of other part of left lower leg limited to breakdown of skin: Secondary | ICD-10-CM | POA: Diagnosis not present

## 2017-08-25 DIAGNOSIS — L97811 Non-pressure chronic ulcer of other part of right lower leg limited to breakdown of skin: Secondary | ICD-10-CM | POA: Diagnosis not present

## 2017-08-25 DIAGNOSIS — I872 Venous insufficiency (chronic) (peripheral): Secondary | ICD-10-CM | POA: Diagnosis not present

## 2017-08-29 DIAGNOSIS — Z79891 Long term (current) use of opiate analgesic: Secondary | ICD-10-CM | POA: Diagnosis not present

## 2017-08-29 DIAGNOSIS — E669 Obesity, unspecified: Secondary | ICD-10-CM | POA: Diagnosis not present

## 2017-08-29 DIAGNOSIS — M5416 Radiculopathy, lumbar region: Secondary | ICD-10-CM | POA: Diagnosis not present

## 2017-08-29 DIAGNOSIS — E119 Type 2 diabetes mellitus without complications: Secondary | ICD-10-CM | POA: Diagnosis not present

## 2017-08-29 DIAGNOSIS — M792 Neuralgia and neuritis, unspecified: Secondary | ICD-10-CM | POA: Diagnosis not present

## 2017-08-29 DIAGNOSIS — G894 Chronic pain syndrome: Secondary | ICD-10-CM | POA: Diagnosis not present

## 2017-08-29 DIAGNOSIS — M961 Postlaminectomy syndrome, not elsewhere classified: Secondary | ICD-10-CM | POA: Diagnosis not present

## 2017-08-31 DIAGNOSIS — L97311 Non-pressure chronic ulcer of right ankle limited to breakdown of skin: Secondary | ICD-10-CM | POA: Diagnosis not present

## 2017-08-31 DIAGNOSIS — L97821 Non-pressure chronic ulcer of other part of left lower leg limited to breakdown of skin: Secondary | ICD-10-CM | POA: Diagnosis not present

## 2017-08-31 DIAGNOSIS — L97811 Non-pressure chronic ulcer of other part of right lower leg limited to breakdown of skin: Secondary | ICD-10-CM | POA: Diagnosis not present

## 2017-08-31 DIAGNOSIS — L97211 Non-pressure chronic ulcer of right calf limited to breakdown of skin: Secondary | ICD-10-CM | POA: Diagnosis not present

## 2017-08-31 DIAGNOSIS — E1151 Type 2 diabetes mellitus with diabetic peripheral angiopathy without gangrene: Secondary | ICD-10-CM | POA: Diagnosis not present

## 2017-08-31 DIAGNOSIS — I872 Venous insufficiency (chronic) (peripheral): Secondary | ICD-10-CM | POA: Diagnosis not present

## 2017-09-01 DIAGNOSIS — I82409 Acute embolism and thrombosis of unspecified deep veins of unspecified lower extremity: Secondary | ICD-10-CM | POA: Diagnosis not present

## 2017-09-01 DIAGNOSIS — I420 Dilated cardiomyopathy: Secondary | ICD-10-CM | POA: Diagnosis not present

## 2017-09-01 DIAGNOSIS — D6851 Activated protein C resistance: Secondary | ICD-10-CM | POA: Diagnosis not present

## 2017-09-01 DIAGNOSIS — I255 Ischemic cardiomyopathy: Secondary | ICD-10-CM | POA: Diagnosis not present

## 2017-09-01 DIAGNOSIS — Z515 Encounter for palliative care: Secondary | ICD-10-CM | POA: Diagnosis not present

## 2017-09-01 DIAGNOSIS — I10 Essential (primary) hypertension: Secondary | ICD-10-CM | POA: Diagnosis not present

## 2017-09-01 DIAGNOSIS — I5022 Chronic systolic (congestive) heart failure: Secondary | ICD-10-CM | POA: Diagnosis not present

## 2017-09-04 DIAGNOSIS — Z86711 Personal history of pulmonary embolism: Secondary | ICD-10-CM | POA: Diagnosis not present

## 2017-09-04 DIAGNOSIS — D6851 Activated protein C resistance: Secondary | ICD-10-CM | POA: Diagnosis not present

## 2017-09-04 DIAGNOSIS — Z7901 Long term (current) use of anticoagulants: Secondary | ICD-10-CM | POA: Diagnosis not present

## 2017-09-05 DIAGNOSIS — R238 Other skin changes: Secondary | ICD-10-CM | POA: Diagnosis not present

## 2017-09-05 DIAGNOSIS — L97921 Non-pressure chronic ulcer of unspecified part of left lower leg limited to breakdown of skin: Secondary | ICD-10-CM | POA: Diagnosis not present

## 2017-09-05 DIAGNOSIS — I10 Essential (primary) hypertension: Secondary | ICD-10-CM | POA: Diagnosis not present

## 2017-09-05 DIAGNOSIS — L03116 Cellulitis of left lower limb: Secondary | ICD-10-CM | POA: Diagnosis not present

## 2017-09-05 DIAGNOSIS — L97911 Non-pressure chronic ulcer of unspecified part of right lower leg limited to breakdown of skin: Secondary | ICD-10-CM | POA: Diagnosis not present

## 2017-09-05 DIAGNOSIS — I255 Ischemic cardiomyopathy: Secondary | ICD-10-CM | POA: Diagnosis not present

## 2017-09-05 DIAGNOSIS — Z515 Encounter for palliative care: Secondary | ICD-10-CM | POA: Diagnosis not present

## 2017-09-05 DIAGNOSIS — I872 Venous insufficiency (chronic) (peripheral): Secondary | ICD-10-CM | POA: Diagnosis not present

## 2017-09-05 DIAGNOSIS — L03115 Cellulitis of right lower limb: Secondary | ICD-10-CM | POA: Diagnosis not present

## 2017-09-05 DIAGNOSIS — E1140 Type 2 diabetes mellitus with diabetic neuropathy, unspecified: Secondary | ICD-10-CM | POA: Diagnosis not present

## 2017-09-05 DIAGNOSIS — Z794 Long term (current) use of insulin: Secondary | ICD-10-CM | POA: Diagnosis not present

## 2017-09-11 DIAGNOSIS — T8131XD Disruption of external operation (surgical) wound, not elsewhere classified, subsequent encounter: Secondary | ICD-10-CM | POA: Diagnosis not present

## 2017-09-11 DIAGNOSIS — I890 Lymphedema, not elsewhere classified: Secondary | ICD-10-CM | POA: Diagnosis not present

## 2017-09-11 DIAGNOSIS — Z6841 Body Mass Index (BMI) 40.0 and over, adult: Secondary | ICD-10-CM | POA: Diagnosis not present

## 2017-09-11 DIAGNOSIS — L97921 Non-pressure chronic ulcer of unspecified part of left lower leg limited to breakdown of skin: Secondary | ICD-10-CM | POA: Diagnosis not present

## 2017-09-11 DIAGNOSIS — I255 Ischemic cardiomyopathy: Secondary | ICD-10-CM | POA: Diagnosis not present

## 2017-09-11 DIAGNOSIS — I739 Peripheral vascular disease, unspecified: Secondary | ICD-10-CM | POA: Diagnosis not present

## 2017-09-11 DIAGNOSIS — I872 Venous insufficiency (chronic) (peripheral): Secondary | ICD-10-CM | POA: Diagnosis not present

## 2017-09-11 DIAGNOSIS — Z515 Encounter for palliative care: Secondary | ICD-10-CM | POA: Diagnosis not present

## 2017-09-11 DIAGNOSIS — L97911 Non-pressure chronic ulcer of unspecified part of right lower leg limited to breakdown of skin: Secondary | ICD-10-CM | POA: Diagnosis not present

## 2017-09-11 DIAGNOSIS — I10 Essential (primary) hypertension: Secondary | ICD-10-CM | POA: Diagnosis not present

## 2017-09-14 DIAGNOSIS — Z86711 Personal history of pulmonary embolism: Secondary | ICD-10-CM | POA: Diagnosis not present

## 2017-09-14 DIAGNOSIS — Z7901 Long term (current) use of anticoagulants: Secondary | ICD-10-CM | POA: Diagnosis not present

## 2017-09-14 DIAGNOSIS — D6851 Activated protein C resistance: Secondary | ICD-10-CM | POA: Diagnosis not present

## 2017-09-18 DIAGNOSIS — Z515 Encounter for palliative care: Secondary | ICD-10-CM | POA: Diagnosis not present

## 2017-09-18 DIAGNOSIS — L97921 Non-pressure chronic ulcer of unspecified part of left lower leg limited to breakdown of skin: Secondary | ICD-10-CM | POA: Diagnosis not present

## 2017-09-18 DIAGNOSIS — L97911 Non-pressure chronic ulcer of unspecified part of right lower leg limited to breakdown of skin: Secondary | ICD-10-CM | POA: Diagnosis not present

## 2017-09-18 DIAGNOSIS — I10 Essential (primary) hypertension: Secondary | ICD-10-CM | POA: Diagnosis not present

## 2017-09-18 DIAGNOSIS — T8131XD Disruption of external operation (surgical) wound, not elsewhere classified, subsequent encounter: Secondary | ICD-10-CM | POA: Diagnosis not present

## 2017-09-18 DIAGNOSIS — Z6841 Body Mass Index (BMI) 40.0 and over, adult: Secondary | ICD-10-CM | POA: Diagnosis not present

## 2017-09-18 DIAGNOSIS — I255 Ischemic cardiomyopathy: Secondary | ICD-10-CM | POA: Diagnosis not present

## 2017-09-18 DIAGNOSIS — I739 Peripheral vascular disease, unspecified: Secondary | ICD-10-CM | POA: Diagnosis not present

## 2017-09-18 DIAGNOSIS — I890 Lymphedema, not elsewhere classified: Secondary | ICD-10-CM | POA: Diagnosis not present

## 2017-09-18 DIAGNOSIS — I872 Venous insufficiency (chronic) (peripheral): Secondary | ICD-10-CM | POA: Diagnosis not present

## 2017-09-21 DIAGNOSIS — Z86711 Personal history of pulmonary embolism: Secondary | ICD-10-CM | POA: Diagnosis not present

## 2017-09-21 DIAGNOSIS — Z7901 Long term (current) use of anticoagulants: Secondary | ICD-10-CM | POA: Diagnosis not present

## 2017-09-21 DIAGNOSIS — D6851 Activated protein C resistance: Secondary | ICD-10-CM | POA: Diagnosis not present

## 2017-09-22 DIAGNOSIS — D6851 Activated protein C resistance: Secondary | ICD-10-CM | POA: Diagnosis not present

## 2017-09-22 DIAGNOSIS — L309 Dermatitis, unspecified: Secondary | ICD-10-CM | POA: Diagnosis not present

## 2017-09-22 DIAGNOSIS — Z7901 Long term (current) use of anticoagulants: Secondary | ICD-10-CM | POA: Diagnosis not present

## 2017-09-22 DIAGNOSIS — I872 Venous insufficiency (chronic) (peripheral): Secondary | ICD-10-CM | POA: Diagnosis not present

## 2017-09-22 DIAGNOSIS — I420 Dilated cardiomyopathy: Secondary | ICD-10-CM | POA: Diagnosis not present

## 2017-09-22 DIAGNOSIS — I890 Lymphedema, not elsewhere classified: Secondary | ICD-10-CM | POA: Diagnosis not present

## 2017-09-25 DIAGNOSIS — L97921 Non-pressure chronic ulcer of unspecified part of left lower leg limited to breakdown of skin: Secondary | ICD-10-CM | POA: Diagnosis not present

## 2017-09-25 DIAGNOSIS — Z6841 Body Mass Index (BMI) 40.0 and over, adult: Secondary | ICD-10-CM | POA: Diagnosis not present

## 2017-09-25 DIAGNOSIS — I872 Venous insufficiency (chronic) (peripheral): Secondary | ICD-10-CM | POA: Diagnosis not present

## 2017-09-25 DIAGNOSIS — Z515 Encounter for palliative care: Secondary | ICD-10-CM | POA: Diagnosis not present

## 2017-09-25 DIAGNOSIS — I739 Peripheral vascular disease, unspecified: Secondary | ICD-10-CM | POA: Diagnosis not present

## 2017-09-25 DIAGNOSIS — I255 Ischemic cardiomyopathy: Secondary | ICD-10-CM | POA: Diagnosis not present

## 2017-09-25 DIAGNOSIS — T8131XD Disruption of external operation (surgical) wound, not elsewhere classified, subsequent encounter: Secondary | ICD-10-CM | POA: Diagnosis not present

## 2017-09-25 DIAGNOSIS — I10 Essential (primary) hypertension: Secondary | ICD-10-CM | POA: Diagnosis not present

## 2017-09-25 DIAGNOSIS — I890 Lymphedema, not elsewhere classified: Secondary | ICD-10-CM | POA: Diagnosis not present

## 2017-09-25 DIAGNOSIS — L97911 Non-pressure chronic ulcer of unspecified part of right lower leg limited to breakdown of skin: Secondary | ICD-10-CM | POA: Diagnosis not present

## 2017-09-28 DIAGNOSIS — I5022 Chronic systolic (congestive) heart failure: Secondary | ICD-10-CM | POA: Diagnosis not present

## 2017-09-28 DIAGNOSIS — Z7901 Long term (current) use of anticoagulants: Secondary | ICD-10-CM | POA: Diagnosis not present

## 2017-09-28 DIAGNOSIS — M48061 Spinal stenosis, lumbar region without neurogenic claudication: Secondary | ICD-10-CM | POA: Diagnosis not present

## 2017-09-28 DIAGNOSIS — I2510 Atherosclerotic heart disease of native coronary artery without angina pectoris: Secondary | ICD-10-CM | POA: Diagnosis not present

## 2017-09-28 DIAGNOSIS — F329 Major depressive disorder, single episode, unspecified: Secondary | ICD-10-CM | POA: Diagnosis not present

## 2017-09-28 DIAGNOSIS — G8929 Other chronic pain: Secondary | ICD-10-CM | POA: Diagnosis not present

## 2017-09-28 DIAGNOSIS — Z794 Long term (current) use of insulin: Secondary | ICD-10-CM | POA: Diagnosis not present

## 2017-09-28 DIAGNOSIS — F411 Generalized anxiety disorder: Secondary | ICD-10-CM | POA: Diagnosis not present

## 2017-09-28 DIAGNOSIS — E1151 Type 2 diabetes mellitus with diabetic peripheral angiopathy without gangrene: Secondary | ICD-10-CM | POA: Diagnosis not present

## 2017-09-28 DIAGNOSIS — L97321 Non-pressure chronic ulcer of left ankle limited to breakdown of skin: Secondary | ICD-10-CM | POA: Diagnosis not present

## 2017-09-28 DIAGNOSIS — L97411 Non-pressure chronic ulcer of right heel and midfoot limited to breakdown of skin: Secondary | ICD-10-CM | POA: Diagnosis not present

## 2017-09-28 DIAGNOSIS — L97811 Non-pressure chronic ulcer of other part of right lower leg limited to breakdown of skin: Secondary | ICD-10-CM | POA: Diagnosis not present

## 2017-09-28 DIAGNOSIS — E039 Hypothyroidism, unspecified: Secondary | ICD-10-CM | POA: Diagnosis not present

## 2017-09-28 DIAGNOSIS — L97511 Non-pressure chronic ulcer of other part of right foot limited to breakdown of skin: Secondary | ICD-10-CM | POA: Diagnosis not present

## 2017-09-28 DIAGNOSIS — I4892 Unspecified atrial flutter: Secondary | ICD-10-CM | POA: Diagnosis not present

## 2017-09-28 DIAGNOSIS — I872 Venous insufficiency (chronic) (peripheral): Secondary | ICD-10-CM | POA: Diagnosis not present

## 2017-09-28 DIAGNOSIS — L97821 Non-pressure chronic ulcer of other part of left lower leg limited to breakdown of skin: Secondary | ICD-10-CM | POA: Diagnosis not present

## 2017-09-28 DIAGNOSIS — I890 Lymphedema, not elsewhere classified: Secondary | ICD-10-CM | POA: Diagnosis not present

## 2017-09-28 DIAGNOSIS — Z7982 Long term (current) use of aspirin: Secondary | ICD-10-CM | POA: Diagnosis not present

## 2017-09-28 DIAGNOSIS — Z9181 History of falling: Secondary | ICD-10-CM | POA: Diagnosis not present

## 2017-09-28 DIAGNOSIS — I110 Hypertensive heart disease with heart failure: Secondary | ICD-10-CM | POA: Diagnosis not present

## 2017-09-28 DIAGNOSIS — E1140 Type 2 diabetes mellitus with diabetic neuropathy, unspecified: Secondary | ICD-10-CM | POA: Diagnosis not present

## 2017-10-02 DIAGNOSIS — L97321 Non-pressure chronic ulcer of left ankle limited to breakdown of skin: Secondary | ICD-10-CM | POA: Diagnosis not present

## 2017-10-02 DIAGNOSIS — L97411 Non-pressure chronic ulcer of right heel and midfoot limited to breakdown of skin: Secondary | ICD-10-CM | POA: Diagnosis not present

## 2017-10-02 DIAGNOSIS — L97511 Non-pressure chronic ulcer of other part of right foot limited to breakdown of skin: Secondary | ICD-10-CM | POA: Diagnosis not present

## 2017-10-02 DIAGNOSIS — L97811 Non-pressure chronic ulcer of other part of right lower leg limited to breakdown of skin: Secondary | ICD-10-CM | POA: Diagnosis not present

## 2017-10-02 DIAGNOSIS — L97821 Non-pressure chronic ulcer of other part of left lower leg limited to breakdown of skin: Secondary | ICD-10-CM | POA: Diagnosis not present

## 2017-10-02 DIAGNOSIS — I872 Venous insufficiency (chronic) (peripheral): Secondary | ICD-10-CM | POA: Diagnosis not present

## 2017-10-03 DIAGNOSIS — E11620 Type 2 diabetes mellitus with diabetic dermatitis: Secondary | ICD-10-CM | POA: Diagnosis not present

## 2017-10-03 DIAGNOSIS — I517 Cardiomegaly: Secondary | ICD-10-CM | POA: Diagnosis not present

## 2017-10-03 DIAGNOSIS — F331 Major depressive disorder, recurrent, moderate: Secondary | ICD-10-CM | POA: Diagnosis not present

## 2017-10-03 DIAGNOSIS — Z7982 Long term (current) use of aspirin: Secondary | ICD-10-CM | POA: Diagnosis not present

## 2017-10-03 DIAGNOSIS — L03115 Cellulitis of right lower limb: Secondary | ICD-10-CM | POA: Diagnosis not present

## 2017-10-03 DIAGNOSIS — L97811 Non-pressure chronic ulcer of other part of right lower leg limited to breakdown of skin: Secondary | ICD-10-CM | POA: Diagnosis not present

## 2017-10-03 DIAGNOSIS — M6281 Muscle weakness (generalized): Secondary | ICD-10-CM | POA: Diagnosis not present

## 2017-10-03 DIAGNOSIS — Z7901 Long term (current) use of anticoagulants: Secondary | ICD-10-CM | POA: Diagnosis not present

## 2017-10-03 DIAGNOSIS — I872 Venous insufficiency (chronic) (peripheral): Secondary | ICD-10-CM | POA: Diagnosis present

## 2017-10-03 DIAGNOSIS — R918 Other nonspecific abnormal finding of lung field: Secondary | ICD-10-CM | POA: Diagnosis not present

## 2017-10-03 DIAGNOSIS — Z6841 Body Mass Index (BMI) 40.0 and over, adult: Secondary | ICD-10-CM | POA: Diagnosis not present

## 2017-10-03 DIAGNOSIS — D6851 Activated protein C resistance: Secondary | ICD-10-CM | POA: Diagnosis not present

## 2017-10-03 DIAGNOSIS — I110 Hypertensive heart disease with heart failure: Secondary | ICD-10-CM | POA: Diagnosis not present

## 2017-10-03 DIAGNOSIS — L97411 Non-pressure chronic ulcer of right heel and midfoot limited to breakdown of skin: Secondary | ICD-10-CM | POA: Diagnosis not present

## 2017-10-03 DIAGNOSIS — R627 Adult failure to thrive: Secondary | ICD-10-CM | POA: Diagnosis present

## 2017-10-03 DIAGNOSIS — I2510 Atherosclerotic heart disease of native coronary artery without angina pectoris: Secondary | ICD-10-CM | POA: Diagnosis not present

## 2017-10-03 DIAGNOSIS — I420 Dilated cardiomyopathy: Secondary | ICD-10-CM | POA: Diagnosis not present

## 2017-10-03 DIAGNOSIS — B86 Scabies: Secondary | ICD-10-CM | POA: Diagnosis not present

## 2017-10-03 DIAGNOSIS — E1140 Type 2 diabetes mellitus with diabetic neuropathy, unspecified: Secondary | ICD-10-CM | POA: Diagnosis not present

## 2017-10-03 DIAGNOSIS — L89890 Pressure ulcer of other site, unstageable: Secondary | ICD-10-CM | POA: Diagnosis not present

## 2017-10-03 DIAGNOSIS — L97321 Non-pressure chronic ulcer of left ankle limited to breakdown of skin: Secondary | ICD-10-CM | POA: Diagnosis not present

## 2017-10-03 DIAGNOSIS — I5022 Chronic systolic (congestive) heart failure: Secondary | ICD-10-CM | POA: Diagnosis present

## 2017-10-03 DIAGNOSIS — E782 Mixed hyperlipidemia: Secondary | ICD-10-CM | POA: Diagnosis not present

## 2017-10-03 DIAGNOSIS — E11628 Type 2 diabetes mellitus with other skin complications: Secondary | ICD-10-CM | POA: Diagnosis not present

## 2017-10-03 DIAGNOSIS — M7989 Other specified soft tissue disorders: Secondary | ICD-10-CM | POA: Diagnosis not present

## 2017-10-03 DIAGNOSIS — I739 Peripheral vascular disease, unspecified: Secondary | ICD-10-CM | POA: Diagnosis not present

## 2017-10-03 DIAGNOSIS — M1990 Unspecified osteoarthritis, unspecified site: Secondary | ICD-10-CM | POA: Diagnosis present

## 2017-10-03 DIAGNOSIS — L8990 Pressure ulcer of unspecified site, unspecified stage: Secondary | ICD-10-CM | POA: Diagnosis not present

## 2017-10-03 DIAGNOSIS — E1169 Type 2 diabetes mellitus with other specified complication: Secondary | ICD-10-CM | POA: Diagnosis not present

## 2017-10-03 DIAGNOSIS — L97511 Non-pressure chronic ulcer of other part of right foot limited to breakdown of skin: Secondary | ICD-10-CM | POA: Diagnosis not present

## 2017-10-03 DIAGNOSIS — F329 Major depressive disorder, single episode, unspecified: Secondary | ICD-10-CM | POA: Diagnosis not present

## 2017-10-03 DIAGNOSIS — D649 Anemia, unspecified: Secondary | ICD-10-CM | POA: Diagnosis present

## 2017-10-03 DIAGNOSIS — G4733 Obstructive sleep apnea (adult) (pediatric): Secondary | ICD-10-CM | POA: Diagnosis not present

## 2017-10-03 DIAGNOSIS — R339 Retention of urine, unspecified: Secondary | ICD-10-CM | POA: Diagnosis present

## 2017-10-03 DIAGNOSIS — N170 Acute kidney failure with tubular necrosis: Secondary | ICD-10-CM | POA: Diagnosis not present

## 2017-10-03 DIAGNOSIS — I130 Hypertensive heart and chronic kidney disease with heart failure and stage 1 through stage 4 chronic kidney disease, or unspecified chronic kidney disease: Secondary | ICD-10-CM | POA: Diagnosis present

## 2017-10-03 DIAGNOSIS — Z794 Long term (current) use of insulin: Secondary | ICD-10-CM | POA: Diagnosis not present

## 2017-10-03 DIAGNOSIS — Z466 Encounter for fitting and adjustment of urinary device: Secondary | ICD-10-CM | POA: Diagnosis not present

## 2017-10-03 DIAGNOSIS — L97821 Non-pressure chronic ulcer of other part of left lower leg limited to breakdown of skin: Secondary | ICD-10-CM | POA: Diagnosis not present

## 2017-10-03 DIAGNOSIS — E039 Hypothyroidism, unspecified: Secondary | ICD-10-CM | POA: Diagnosis not present

## 2017-10-03 DIAGNOSIS — L03116 Cellulitis of left lower limb: Secondary | ICD-10-CM | POA: Diagnosis not present

## 2017-10-03 DIAGNOSIS — I10 Essential (primary) hypertension: Secondary | ICD-10-CM | POA: Diagnosis not present

## 2017-10-03 DIAGNOSIS — Z7409 Other reduced mobility: Secondary | ICD-10-CM | POA: Diagnosis not present

## 2017-10-03 DIAGNOSIS — R079 Chest pain, unspecified: Secondary | ICD-10-CM | POA: Diagnosis not present

## 2017-10-03 DIAGNOSIS — R2243 Localized swelling, mass and lump, lower limb, bilateral: Secondary | ICD-10-CM | POA: Diagnosis not present

## 2017-10-03 DIAGNOSIS — I878 Other specified disorders of veins: Secondary | ICD-10-CM | POA: Diagnosis present

## 2017-10-03 DIAGNOSIS — N182 Chronic kidney disease, stage 2 (mild): Secondary | ICD-10-CM | POA: Diagnosis present

## 2017-10-03 DIAGNOSIS — R0989 Other specified symptoms and signs involving the circulatory and respiratory systems: Secondary | ICD-10-CM | POA: Diagnosis not present

## 2017-10-03 DIAGNOSIS — F419 Anxiety disorder, unspecified: Secondary | ICD-10-CM | POA: Diagnosis present

## 2017-10-10 DIAGNOSIS — L97512 Non-pressure chronic ulcer of other part of right foot with fat layer exposed: Secondary | ICD-10-CM | POA: Diagnosis not present

## 2017-10-10 DIAGNOSIS — S81801D Unspecified open wound, right lower leg, subsequent encounter: Secondary | ICD-10-CM | POA: Diagnosis not present

## 2017-10-10 DIAGNOSIS — M6281 Muscle weakness (generalized): Secondary | ICD-10-CM | POA: Diagnosis not present

## 2017-10-10 DIAGNOSIS — L97819 Non-pressure chronic ulcer of other part of right lower leg with unspecified severity: Secondary | ICD-10-CM | POA: Diagnosis not present

## 2017-10-10 DIAGNOSIS — L03115 Cellulitis of right lower limb: Secondary | ICD-10-CM | POA: Diagnosis not present

## 2017-10-10 DIAGNOSIS — F329 Major depressive disorder, single episode, unspecified: Secondary | ICD-10-CM | POA: Diagnosis not present

## 2017-10-10 DIAGNOSIS — G4730 Sleep apnea, unspecified: Secondary | ICD-10-CM | POA: Diagnosis not present

## 2017-10-10 DIAGNOSIS — L8990 Pressure ulcer of unspecified site, unspecified stage: Secondary | ICD-10-CM | POA: Diagnosis not present

## 2017-10-10 DIAGNOSIS — L97523 Non-pressure chronic ulcer of other part of left foot with necrosis of muscle: Secondary | ICD-10-CM | POA: Diagnosis not present

## 2017-10-10 DIAGNOSIS — Z86718 Personal history of other venous thrombosis and embolism: Secondary | ICD-10-CM | POA: Diagnosis not present

## 2017-10-10 DIAGNOSIS — I255 Ischemic cardiomyopathy: Secondary | ICD-10-CM | POA: Diagnosis not present

## 2017-10-10 DIAGNOSIS — E039 Hypothyroidism, unspecified: Secondary | ICD-10-CM | POA: Diagnosis not present

## 2017-10-10 DIAGNOSIS — Z79899 Other long term (current) drug therapy: Secondary | ICD-10-CM | POA: Diagnosis not present

## 2017-10-10 DIAGNOSIS — I2510 Atherosclerotic heart disease of native coronary artery without angina pectoris: Secondary | ICD-10-CM | POA: Diagnosis not present

## 2017-10-10 DIAGNOSIS — I484 Atypical atrial flutter: Secondary | ICD-10-CM | POA: Diagnosis not present

## 2017-10-10 DIAGNOSIS — I10 Essential (primary) hypertension: Secondary | ICD-10-CM | POA: Diagnosis not present

## 2017-10-10 DIAGNOSIS — Z466 Encounter for fitting and adjustment of urinary device: Secondary | ICD-10-CM | POA: Diagnosis not present

## 2017-10-10 DIAGNOSIS — L89890 Pressure ulcer of other site, unstageable: Secondary | ICD-10-CM | POA: Diagnosis not present

## 2017-10-10 DIAGNOSIS — M1990 Unspecified osteoarthritis, unspecified site: Secondary | ICD-10-CM | POA: Diagnosis not present

## 2017-10-10 DIAGNOSIS — E11620 Type 2 diabetes mellitus with diabetic dermatitis: Secondary | ICD-10-CM | POA: Diagnosis not present

## 2017-10-10 DIAGNOSIS — M7989 Other specified soft tissue disorders: Secondary | ICD-10-CM | POA: Diagnosis not present

## 2017-10-10 DIAGNOSIS — R52 Pain, unspecified: Secondary | ICD-10-CM | POA: Diagnosis not present

## 2017-10-10 DIAGNOSIS — E1169 Type 2 diabetes mellitus with other specified complication: Secondary | ICD-10-CM | POA: Diagnosis not present

## 2017-10-10 DIAGNOSIS — Z515 Encounter for palliative care: Secondary | ICD-10-CM | POA: Diagnosis not present

## 2017-10-10 DIAGNOSIS — Z7409 Other reduced mobility: Secondary | ICD-10-CM | POA: Diagnosis not present

## 2017-10-10 DIAGNOSIS — I420 Dilated cardiomyopathy: Secondary | ICD-10-CM | POA: Diagnosis not present

## 2017-10-10 DIAGNOSIS — E785 Hyperlipidemia, unspecified: Secondary | ICD-10-CM | POA: Diagnosis not present

## 2017-10-10 DIAGNOSIS — R339 Retention of urine, unspecified: Secondary | ICD-10-CM | POA: Diagnosis not present

## 2017-10-10 DIAGNOSIS — I5022 Chronic systolic (congestive) heart failure: Secondary | ICD-10-CM | POA: Diagnosis not present

## 2017-10-10 DIAGNOSIS — E11628 Type 2 diabetes mellitus with other skin complications: Secondary | ICD-10-CM | POA: Diagnosis not present

## 2017-10-10 DIAGNOSIS — E119 Type 2 diabetes mellitus without complications: Secondary | ICD-10-CM | POA: Diagnosis not present

## 2017-10-10 DIAGNOSIS — B354 Tinea corporis: Secondary | ICD-10-CM | POA: Diagnosis not present

## 2017-10-10 DIAGNOSIS — Z794 Long term (current) use of insulin: Secondary | ICD-10-CM | POA: Diagnosis not present

## 2017-10-10 DIAGNOSIS — L98419 Non-pressure chronic ulcer of buttock with unspecified severity: Secondary | ICD-10-CM | POA: Diagnosis not present

## 2017-10-10 DIAGNOSIS — G8929 Other chronic pain: Secondary | ICD-10-CM | POA: Diagnosis not present

## 2017-10-10 DIAGNOSIS — D6851 Activated protein C resistance: Secondary | ICD-10-CM | POA: Diagnosis not present

## 2017-10-10 DIAGNOSIS — I83018 Varicose veins of right lower extremity with ulcer other part of lower leg: Secondary | ICD-10-CM | POA: Diagnosis not present

## 2017-10-10 DIAGNOSIS — E1140 Type 2 diabetes mellitus with diabetic neuropathy, unspecified: Secondary | ICD-10-CM | POA: Diagnosis not present

## 2017-10-10 DIAGNOSIS — I872 Venous insufficiency (chronic) (peripheral): Secondary | ICD-10-CM | POA: Diagnosis not present

## 2017-10-10 DIAGNOSIS — L03116 Cellulitis of left lower limb: Secondary | ICD-10-CM | POA: Diagnosis not present

## 2017-10-10 DIAGNOSIS — L97522 Non-pressure chronic ulcer of other part of left foot with fat layer exposed: Secondary | ICD-10-CM | POA: Diagnosis not present

## 2017-10-10 DIAGNOSIS — I110 Hypertensive heart disease with heart failure: Secondary | ICD-10-CM | POA: Diagnosis not present

## 2017-10-10 DIAGNOSIS — Z7982 Long term (current) use of aspirin: Secondary | ICD-10-CM | POA: Diagnosis not present

## 2017-10-10 DIAGNOSIS — G4733 Obstructive sleep apnea (adult) (pediatric): Secondary | ICD-10-CM | POA: Diagnosis not present

## 2017-10-10 DIAGNOSIS — D649 Anemia, unspecified: Secondary | ICD-10-CM | POA: Diagnosis not present

## 2017-10-10 DIAGNOSIS — E782 Mixed hyperlipidemia: Secondary | ICD-10-CM | POA: Diagnosis not present

## 2017-10-10 DIAGNOSIS — F419 Anxiety disorder, unspecified: Secondary | ICD-10-CM | POA: Diagnosis not present

## 2017-10-10 DIAGNOSIS — L97529 Non-pressure chronic ulcer of other part of left foot with unspecified severity: Secondary | ICD-10-CM | POA: Diagnosis not present

## 2017-10-10 DIAGNOSIS — F331 Major depressive disorder, recurrent, moderate: Secondary | ICD-10-CM | POA: Diagnosis not present

## 2017-10-10 DIAGNOSIS — L97509 Non-pressure chronic ulcer of other part of unspecified foot with unspecified severity: Secondary | ICD-10-CM | POA: Diagnosis not present

## 2017-10-10 DIAGNOSIS — S81802D Unspecified open wound, left lower leg, subsequent encounter: Secondary | ICD-10-CM | POA: Diagnosis not present

## 2017-10-10 DIAGNOSIS — I5020 Unspecified systolic (congestive) heart failure: Secondary | ICD-10-CM | POA: Diagnosis not present

## 2017-10-10 DIAGNOSIS — Z7901 Long term (current) use of anticoagulants: Secondary | ICD-10-CM | POA: Diagnosis not present

## 2017-10-11 DIAGNOSIS — L89890 Pressure ulcer of other site, unstageable: Secondary | ICD-10-CM | POA: Diagnosis not present

## 2017-10-11 DIAGNOSIS — L97523 Non-pressure chronic ulcer of other part of left foot with necrosis of muscle: Secondary | ICD-10-CM | POA: Diagnosis not present

## 2017-10-11 DIAGNOSIS — L98419 Non-pressure chronic ulcer of buttock with unspecified severity: Secondary | ICD-10-CM | POA: Diagnosis not present

## 2017-10-13 DIAGNOSIS — L03115 Cellulitis of right lower limb: Secondary | ICD-10-CM | POA: Diagnosis not present

## 2017-10-13 DIAGNOSIS — I420 Dilated cardiomyopathy: Secondary | ICD-10-CM | POA: Diagnosis not present

## 2017-10-13 DIAGNOSIS — R339 Retention of urine, unspecified: Secondary | ICD-10-CM | POA: Diagnosis not present

## 2017-10-13 DIAGNOSIS — F329 Major depressive disorder, single episode, unspecified: Secondary | ICD-10-CM | POA: Diagnosis not present

## 2017-10-13 DIAGNOSIS — E785 Hyperlipidemia, unspecified: Secondary | ICD-10-CM | POA: Diagnosis not present

## 2017-10-13 DIAGNOSIS — I872 Venous insufficiency (chronic) (peripheral): Secondary | ICD-10-CM | POA: Diagnosis not present

## 2017-10-13 DIAGNOSIS — L03116 Cellulitis of left lower limb: Secondary | ICD-10-CM | POA: Diagnosis not present

## 2017-10-13 DIAGNOSIS — D6851 Activated protein C resistance: Secondary | ICD-10-CM | POA: Diagnosis not present

## 2017-10-13 DIAGNOSIS — G4730 Sleep apnea, unspecified: Secondary | ICD-10-CM | POA: Diagnosis not present

## 2017-10-13 DIAGNOSIS — I10 Essential (primary) hypertension: Secondary | ICD-10-CM | POA: Diagnosis not present

## 2017-10-13 DIAGNOSIS — E119 Type 2 diabetes mellitus without complications: Secondary | ICD-10-CM | POA: Diagnosis not present

## 2017-10-13 DIAGNOSIS — B354 Tinea corporis: Secondary | ICD-10-CM | POA: Diagnosis not present

## 2017-10-17 DIAGNOSIS — L97523 Non-pressure chronic ulcer of other part of left foot with necrosis of muscle: Secondary | ICD-10-CM | POA: Diagnosis not present

## 2017-10-17 DIAGNOSIS — Z7901 Long term (current) use of anticoagulants: Secondary | ICD-10-CM | POA: Diagnosis not present

## 2017-10-17 DIAGNOSIS — I5022 Chronic systolic (congestive) heart failure: Secondary | ICD-10-CM | POA: Diagnosis not present

## 2017-10-17 DIAGNOSIS — E1169 Type 2 diabetes mellitus with other specified complication: Secondary | ICD-10-CM | POA: Diagnosis not present

## 2017-10-17 DIAGNOSIS — R52 Pain, unspecified: Secondary | ICD-10-CM | POA: Diagnosis not present

## 2017-10-17 DIAGNOSIS — L03116 Cellulitis of left lower limb: Secondary | ICD-10-CM | POA: Diagnosis not present

## 2017-10-17 DIAGNOSIS — L97512 Non-pressure chronic ulcer of other part of right foot with fat layer exposed: Secondary | ICD-10-CM | POA: Diagnosis not present

## 2017-10-17 DIAGNOSIS — G8929 Other chronic pain: Secondary | ICD-10-CM | POA: Diagnosis not present

## 2017-10-17 DIAGNOSIS — L03115 Cellulitis of right lower limb: Secondary | ICD-10-CM | POA: Diagnosis not present

## 2017-10-17 DIAGNOSIS — L97819 Non-pressure chronic ulcer of other part of right lower leg with unspecified severity: Secondary | ICD-10-CM | POA: Diagnosis not present

## 2017-10-17 DIAGNOSIS — S81801D Unspecified open wound, right lower leg, subsequent encounter: Secondary | ICD-10-CM | POA: Diagnosis not present

## 2017-10-17 DIAGNOSIS — Z86718 Personal history of other venous thrombosis and embolism: Secondary | ICD-10-CM | POA: Diagnosis not present

## 2017-10-17 DIAGNOSIS — S81802D Unspecified open wound, left lower leg, subsequent encounter: Secondary | ICD-10-CM | POA: Diagnosis not present

## 2017-10-24 DIAGNOSIS — I83018 Varicose veins of right lower extremity with ulcer other part of lower leg: Secondary | ICD-10-CM | POA: Diagnosis not present

## 2017-10-24 DIAGNOSIS — L97523 Non-pressure chronic ulcer of other part of left foot with necrosis of muscle: Secondary | ICD-10-CM | POA: Diagnosis not present

## 2017-10-24 DIAGNOSIS — L97522 Non-pressure chronic ulcer of other part of left foot with fat layer exposed: Secondary | ICD-10-CM | POA: Diagnosis not present

## 2017-10-31 DIAGNOSIS — I83018 Varicose veins of right lower extremity with ulcer other part of lower leg: Secondary | ICD-10-CM | POA: Diagnosis not present

## 2017-10-31 DIAGNOSIS — L97523 Non-pressure chronic ulcer of other part of left foot with necrosis of muscle: Secondary | ICD-10-CM | POA: Diagnosis not present

## 2017-10-31 DIAGNOSIS — L89890 Pressure ulcer of other site, unstageable: Secondary | ICD-10-CM | POA: Diagnosis not present

## 2017-11-01 DIAGNOSIS — I255 Ischemic cardiomyopathy: Secondary | ICD-10-CM | POA: Diagnosis not present

## 2017-11-01 DIAGNOSIS — I484 Atypical atrial flutter: Secondary | ICD-10-CM | POA: Diagnosis not present

## 2017-11-01 DIAGNOSIS — Z515 Encounter for palliative care: Secondary | ICD-10-CM | POA: Diagnosis not present

## 2017-11-01 DIAGNOSIS — I10 Essential (primary) hypertension: Secondary | ICD-10-CM | POA: Diagnosis not present

## 2017-11-01 DIAGNOSIS — Z79899 Other long term (current) drug therapy: Secondary | ICD-10-CM | POA: Diagnosis not present

## 2017-11-06 DIAGNOSIS — F329 Major depressive disorder, single episode, unspecified: Secondary | ICD-10-CM | POA: Diagnosis not present

## 2017-11-06 DIAGNOSIS — I5020 Unspecified systolic (congestive) heart failure: Secondary | ICD-10-CM | POA: Diagnosis not present

## 2017-11-06 DIAGNOSIS — E119 Type 2 diabetes mellitus without complications: Secondary | ICD-10-CM | POA: Diagnosis not present

## 2017-11-06 DIAGNOSIS — I2510 Atherosclerotic heart disease of native coronary artery without angina pectoris: Secondary | ICD-10-CM | POA: Diagnosis not present

## 2017-11-06 DIAGNOSIS — G4733 Obstructive sleep apnea (adult) (pediatric): Secondary | ICD-10-CM | POA: Diagnosis not present

## 2017-11-06 DIAGNOSIS — I10 Essential (primary) hypertension: Secondary | ICD-10-CM | POA: Diagnosis not present

## 2017-11-06 DIAGNOSIS — L03116 Cellulitis of left lower limb: Secondary | ICD-10-CM | POA: Diagnosis not present

## 2017-11-06 DIAGNOSIS — E785 Hyperlipidemia, unspecified: Secondary | ICD-10-CM | POA: Diagnosis not present

## 2017-11-07 DIAGNOSIS — L97523 Non-pressure chronic ulcer of other part of left foot with necrosis of muscle: Secondary | ICD-10-CM | POA: Diagnosis not present

## 2017-11-07 DIAGNOSIS — L97529 Non-pressure chronic ulcer of other part of left foot with unspecified severity: Secondary | ICD-10-CM | POA: Diagnosis not present

## 2017-11-07 DIAGNOSIS — I83018 Varicose veins of right lower extremity with ulcer other part of lower leg: Secondary | ICD-10-CM | POA: Diagnosis not present

## 2017-11-08 DIAGNOSIS — I872 Venous insufficiency (chronic) (peripheral): Secondary | ICD-10-CM | POA: Diagnosis not present

## 2017-11-08 DIAGNOSIS — L97411 Non-pressure chronic ulcer of right heel and midfoot limited to breakdown of skin: Secondary | ICD-10-CM | POA: Diagnosis not present

## 2017-11-08 DIAGNOSIS — L97821 Non-pressure chronic ulcer of other part of left lower leg limited to breakdown of skin: Secondary | ICD-10-CM | POA: Diagnosis not present

## 2017-11-08 DIAGNOSIS — L97811 Non-pressure chronic ulcer of other part of right lower leg limited to breakdown of skin: Secondary | ICD-10-CM | POA: Diagnosis not present

## 2017-11-08 DIAGNOSIS — L97321 Non-pressure chronic ulcer of left ankle limited to breakdown of skin: Secondary | ICD-10-CM | POA: Diagnosis not present

## 2017-11-08 DIAGNOSIS — L97511 Non-pressure chronic ulcer of other part of right foot limited to breakdown of skin: Secondary | ICD-10-CM | POA: Diagnosis not present

## 2017-11-13 DIAGNOSIS — L97411 Non-pressure chronic ulcer of right heel and midfoot limited to breakdown of skin: Secondary | ICD-10-CM | POA: Diagnosis not present

## 2017-11-13 DIAGNOSIS — L97511 Non-pressure chronic ulcer of other part of right foot limited to breakdown of skin: Secondary | ICD-10-CM | POA: Diagnosis not present

## 2017-11-13 DIAGNOSIS — I872 Venous insufficiency (chronic) (peripheral): Secondary | ICD-10-CM | POA: Diagnosis not present

## 2017-11-13 DIAGNOSIS — L97821 Non-pressure chronic ulcer of other part of left lower leg limited to breakdown of skin: Secondary | ICD-10-CM | POA: Diagnosis not present

## 2017-11-13 DIAGNOSIS — L97321 Non-pressure chronic ulcer of left ankle limited to breakdown of skin: Secondary | ICD-10-CM | POA: Diagnosis not present

## 2017-11-13 DIAGNOSIS — L97811 Non-pressure chronic ulcer of other part of right lower leg limited to breakdown of skin: Secondary | ICD-10-CM | POA: Diagnosis not present

## 2017-11-14 DIAGNOSIS — L97411 Non-pressure chronic ulcer of right heel and midfoot limited to breakdown of skin: Secondary | ICD-10-CM | POA: Diagnosis not present

## 2017-11-14 DIAGNOSIS — L97511 Non-pressure chronic ulcer of other part of right foot limited to breakdown of skin: Secondary | ICD-10-CM | POA: Diagnosis not present

## 2017-11-14 DIAGNOSIS — L97811 Non-pressure chronic ulcer of other part of right lower leg limited to breakdown of skin: Secondary | ICD-10-CM | POA: Diagnosis not present

## 2017-11-14 DIAGNOSIS — L97321 Non-pressure chronic ulcer of left ankle limited to breakdown of skin: Secondary | ICD-10-CM | POA: Diagnosis not present

## 2017-11-14 DIAGNOSIS — L97821 Non-pressure chronic ulcer of other part of left lower leg limited to breakdown of skin: Secondary | ICD-10-CM | POA: Diagnosis not present

## 2017-11-14 DIAGNOSIS — I872 Venous insufficiency (chronic) (peripheral): Secondary | ICD-10-CM | POA: Diagnosis not present

## 2017-11-15 ENCOUNTER — Telehealth: Payer: Self-pay | Admitting: Emergency Medicine

## 2017-11-15 DIAGNOSIS — D6851 Activated protein C resistance: Secondary | ICD-10-CM | POA: Diagnosis not present

## 2017-11-15 DIAGNOSIS — Z7901 Long term (current) use of anticoagulants: Secondary | ICD-10-CM | POA: Diagnosis not present

## 2017-11-15 DIAGNOSIS — Z86711 Personal history of pulmonary embolism: Secondary | ICD-10-CM | POA: Diagnosis not present

## 2017-11-15 NOTE — Telephone Encounter (Signed)
OK to approve request, let me know if anything else needed!

## 2017-11-15 NOTE — Telephone Encounter (Signed)
Call from [facility] in [facility]; requesting orders for skilled nursing care twice a week; also to enroll him in Telehealth Program; spoke with Dr.Alexander and she approved both; called and left VM . pk

## 2017-11-17 DIAGNOSIS — L97811 Non-pressure chronic ulcer of other part of right lower leg limited to breakdown of skin: Secondary | ICD-10-CM | POA: Diagnosis not present

## 2017-11-17 DIAGNOSIS — L97511 Non-pressure chronic ulcer of other part of right foot limited to breakdown of skin: Secondary | ICD-10-CM | POA: Diagnosis not present

## 2017-11-17 DIAGNOSIS — L97821 Non-pressure chronic ulcer of other part of left lower leg limited to breakdown of skin: Secondary | ICD-10-CM | POA: Diagnosis not present

## 2017-11-17 DIAGNOSIS — I872 Venous insufficiency (chronic) (peripheral): Secondary | ICD-10-CM | POA: Diagnosis not present

## 2017-11-17 DIAGNOSIS — L97411 Non-pressure chronic ulcer of right heel and midfoot limited to breakdown of skin: Secondary | ICD-10-CM | POA: Diagnosis not present

## 2017-11-17 DIAGNOSIS — L97321 Non-pressure chronic ulcer of left ankle limited to breakdown of skin: Secondary | ICD-10-CM | POA: Diagnosis not present

## 2017-11-20 ENCOUNTER — Ambulatory Visit (INDEPENDENT_AMBULATORY_CARE_PROVIDER_SITE_OTHER): Payer: Medicare Other | Admitting: Osteopathic Medicine

## 2017-11-20 ENCOUNTER — Encounter: Payer: Self-pay | Admitting: Osteopathic Medicine

## 2017-11-20 VITALS — BP 143/89 | HR 88

## 2017-11-20 DIAGNOSIS — I10 Essential (primary) hypertension: Secondary | ICD-10-CM | POA: Diagnosis not present

## 2017-11-20 DIAGNOSIS — M48062 Spinal stenosis, lumbar region with neurogenic claudication: Secondary | ICD-10-CM

## 2017-11-20 DIAGNOSIS — R52 Pain, unspecified: Secondary | ICD-10-CM

## 2017-11-20 DIAGNOSIS — E1159 Type 2 diabetes mellitus with other circulatory complications: Secondary | ICD-10-CM | POA: Diagnosis not present

## 2017-11-20 DIAGNOSIS — E119 Type 2 diabetes mellitus without complications: Secondary | ICD-10-CM | POA: Diagnosis not present

## 2017-11-20 DIAGNOSIS — I255 Ischemic cardiomyopathy: Secondary | ICD-10-CM

## 2017-11-20 DIAGNOSIS — Z7901 Long term (current) use of anticoagulants: Secondary | ICD-10-CM | POA: Diagnosis not present

## 2017-11-20 DIAGNOSIS — L97921 Non-pressure chronic ulcer of unspecified part of left lower leg limited to breakdown of skin: Secondary | ICD-10-CM | POA: Diagnosis not present

## 2017-11-20 DIAGNOSIS — I739 Peripheral vascular disease, unspecified: Secondary | ICD-10-CM | POA: Diagnosis not present

## 2017-11-20 DIAGNOSIS — I872 Venous insufficiency (chronic) (peripheral): Secondary | ICD-10-CM | POA: Diagnosis not present

## 2017-11-20 DIAGNOSIS — G894 Chronic pain syndrome: Secondary | ICD-10-CM | POA: Diagnosis not present

## 2017-11-20 DIAGNOSIS — Z515 Encounter for palliative care: Secondary | ICD-10-CM | POA: Diagnosis not present

## 2017-11-20 DIAGNOSIS — E039 Hypothyroidism, unspecified: Secondary | ICD-10-CM | POA: Diagnosis not present

## 2017-11-20 DIAGNOSIS — R5381 Other malaise: Secondary | ICD-10-CM

## 2017-11-20 DIAGNOSIS — Z6841 Body Mass Index (BMI) 40.0 and over, adult: Secondary | ICD-10-CM

## 2017-11-20 DIAGNOSIS — L97911 Non-pressure chronic ulcer of unspecified part of right lower leg limited to breakdown of skin: Secondary | ICD-10-CM | POA: Diagnosis not present

## 2017-11-20 DIAGNOSIS — I890 Lymphedema, not elsewhere classified: Secondary | ICD-10-CM | POA: Diagnosis not present

## 2017-11-20 LAB — POCT GLYCOSYLATED HEMOGLOBIN (HGB A1C): HEMOGLOBIN A1C: 8.1

## 2017-11-20 MED ORDER — INSULIN GLARGINE 300 UNIT/ML ~~LOC~~ SOPN: 20.0000 [IU] | PEN_INJECTOR | Freq: Every day | SUBCUTANEOUS | 6 refills | Status: AC

## 2017-11-20 MED ORDER — OXYCODONE HCL 10 MG PO TABS: 10.0000 mg | ORAL_TABLET | Freq: Two times a day (BID) | ORAL | 0 refills | Status: DC | PRN

## 2017-11-20 MED ORDER — MORPHINE SULFATE ER 60 MG PO TBCR: 60.0000 mg | EXTENDED_RELEASE_TABLET | Freq: Two times a day (BID) | ORAL | 0 refills | Status: DC

## 2017-11-20 NOTE — Progress Notes (Signed)
HPI: Mark Dean is a 51 y.o. male who  has no past medical history on file.  he presents to [facility] today, 11/20/17,  for chief complaint of:  Chief Complaint  Patient presents with  . Hospitalization Follow-up   Discharge summary reviewed: 7-day hospital stay 10/03/17 after being found at home in [place] situation - found by home health covered in feces, treated in hospital for cellulitis and scabies. Pain had made him unable to care for himself.   No records form rehab as of yet. We reordered home health.   Pain: chronic LBP, chronic leg sores and associated debility and ambulatory dysfunction have been very difficult to manage, patient adamantly has refused assisted living or SNF placement. He is doing well on current pain regimen with some mild withdrawal symptoms if he misses a dose of MS Contin. Se previous notes re: other pain management regimens attempted.   DM2: Rehab took him off metformin. Glc has been 150 or lower. A1C a bit higher today.   Skin: wound care and vascular surgery following for chronic venous stasis issues. He was at wound care earlier today.    Past medical, surgical, social and family history reviewed:  Patient Active Problem List   Diagnosis Date Noted  . Pain management 08/11/2017  . Hypogonadism 04/15/2017  . Atypical atrial flutter ([place]) 02/22/2017  . Ischemic cardiomyopathy 02/22/2017  . S/P coronary artery stent placement 02/22/2017  . Acute bilateral deep vein thrombosis (DVT) of femoral veins ([place]) 02/21/2017  . Acute renal failure with acute cortical necrosis ([place]) 02/21/2017  . Chronic diarrhea 02/17/2017  . Debility 02/17/2017  . Depression, recurrent ([place]) 02/10/2017  . Other chronic pain 02/10/2017  . History of pulmonary embolism 02/10/2017  . Acquired hypothyroidism 02/10/2017  . Uncontrolled type 2 diabetes mellitus with complication, with long-term current use of insulin ([place]) 02/10/2017   . Cellulitis 04/18/2016  . Bilateral diabetic foot ulcer associated with secondary diabetes mellitus ([place]) 01/25/2016  . Edema extremities 12/07/2015  . QT prolongation 12/07/2015  . MDD (major depressive disorder), recurrent severe, without psychosis ([place]) 11/19/2015  . Dehiscence of surgical wound 11/09/2015  . Delayed surgical wound healing 11/03/2015  . Secondary lymphedema 11/03/2015  . Acute on chronic systolic heart failure ([place]) 10/23/2015  . Volume overload 10/23/2015  . S/P right knee surgery 10/19/2015  . Chronic venous insufficiency 12/23/2014  . PVD (peripheral vascular disease) ([place]) 12/23/2014  . Onychomycosis due to dermatophyte 09/12/2013  . Enthesopathy of foot 09/12/2013  . Keratosis 09/12/2013  . Depression, major 09/03/2013  . Generalized anxiety disorder 09/01/2013  . Bilateral leg pain 07/22/2013  . DVT, femoral, chronic ([place]) 07/10/2013  . Stasis dermatitis of both legs 07/10/2013  . Lumbar stenosis 09/21/2012  . Spinal stenosis of lumbar region 09/21/2012  . CAD (coronary artery disease) 09/16/2012  . Essential hypertension 11/21/2011  . Obstructive sleep apnea syndrome in adult 11/21/2011  . Pulmonary embolism ([place]) 11/21/2011  . Heterozygous factor V Leiden mutation ([place]) 11/17/2011  . Lymphedema of lower extremity 10/19/2011  . Atherosclerosis of native coronary artery of native heart without angina pectoris 02/22/2011  . Dilated cardiomyopathy ([place]) 02/22/2011  . Mixed diabetic hyperlipidemia associated with type 2 diabetes mellitus ([place]) 02/22/2011  . Morbid obesity with BMI of 50.0-59.9, adult ([place]) 07/01/2009    No past surgical history on file.  Social History   Tobacco Use  . Smoking status: Unknown If Ever Smoked  . Smokeless tobacco: Never Used  Substance Use Topics  .  Alcohol use: Not on file    No family history on file.   Current medication list and allergy/intolerance information reviewed:    Current Outpatient Medications   Medication Sig Dispense Refill  . amiodarone (PACERONE) 200 MG tablet Take 200 mg by mouth daily.    Marland Kitchen aspirin EC 81 MG tablet Take 81 mg by mouth daily.    . blood glucose meter kit and supplies KIT Dispense based on patient and insurance preference. Use up to four times daily as directed. Please include lancets, test strips, control solution. 1 each 99  . carvedilol (COREG) 6.25 MG tablet Take 6.25 mg by mouth daily.    . digoxin (LANOXIN) 0.125 MG tablet Take 125 mcg by mouth every other day.    . docusate sodium (COLACE) 100 MG capsule Take 1 capsule (100 mg total) by mouth 2 (two) times daily. 60 capsule 1  . DULoxetine (CYMBALTA) 60 MG capsule Take 1 capsule (60 mg total) by mouth 2 (two) times daily. 180 capsule 1  . glucose blood test strip Use up to 4 times per day as directed with glucometer. Disp: 100. Refill x99 100 each 99  . hydrALAZINE (APRESOLINE) 25 MG tablet Take 25 mg by mouth 2 (two) times daily.    . Insulin Glargine (TOUJEO SOLOSTAR) 300 UNIT/ML SOPN Inject 30 Units into the skin daily. Increase by 3 units every 3 days to goal fasting blood sugar 80-130 3 mL 6  . Insulin Pen Needle 29G X 12MM MISC Use with Toujeo pen as directed 100 each 99  . Lancet Device MISC Use up to 4 times per day as directed with glucometer. Disp: 100. Refill x99 100 each 99  . levothyroxine (SYNTHROID, LEVOTHROID) 112 MCG tablet Take 1 tablet (112 mcg total) by mouth daily before breakfast. Plan to repeat labs approx 06/07/17 60 tablet 0  . lisinopril (PRINIVIL,ZESTRIL) 5 MG tablet Take 5 mg by mouth daily.    . metFORMIN (GLUCOPHAGE) 1000 MG tablet Take 1 tablet (1,000 mg total) by mouth 2 (two) times daily with a meal. 180 tablet 1  . potassium chloride SA (K-DUR,KLOR-CON) 20 MEQ tablet Take 20 mEq by mouth daily.    Marland Kitchen torsemide (DEMADEX) 100 MG tablet Take 50 mg by mouth daily.      No current facility-administered medications for this visit.     No Known Allergies    Review of  Systems:  Constitutional:  No  fever, no chills, +recent illness, No unintentional weight changes. +chronic stable significant fatigue.   HEENT: No  headache, no vision change  Cardiac: No  chest pain, No  pressure, No palpitations, No  Orthopnea  Respiratory:  No  shortness of breath. No  Cough  Gastrointestinal: No  abdominal pain, No  nausea, No  vomiting  Musculoskeletal: No new myalgia/arthralgia  Psychiatric: No  concerns with depression, No  concerns with anxiety   Exam:  BP (!) 143/89   Pulse 88   Constitutional: VS see above. General Appearance: alert, well-developed, well-nourished, NAD  Eyes: Normal lids and conjunctive, non-icteric sclera  Ears, Nose, Mouth, Throat: MMM, Normal external inspection ears/nares/mouth/lips/gums.   Neck: No masses, trachea midline. No thyroid enlargement. No tenderness/mass appreciated. No lymphadenopathy  Respiratory: Normal respiratory effort. no wheeze, no rhonchi, no rales  Cardiovascular: S1/S2 normal, no murmur, no rub/gallop auscultated. RRR.   Neurological: Normal balance/coordination.   Psychiatric: Normal judgment/insight. Normal mood and affect. Oriented x3.    Results for orders placed or performed in visit on  11/20/17 (from the past 72 hour(s))  POCT HgB A1C     Status: None   Collection Time: 11/20/17  3:19 PM  Result Value Ref Range   Hemoglobin A1C 8.1        ASSESSMENT/PLAN:   Chronic pain syndrome - Plan: morphine (MS CONTIN) 60 MG 12 hr tablet, Oxycodone HCl 10 MG TABS  Spinal stenosis of lumbar region with neurogenic claudication - Plan: morphine (MS CONTIN) 60 MG 12 hr tablet, Oxycodone HCl 10 MG TABS  Pain management - Plan: morphine (MS CONTIN) 60 MG 12 hr tablet, Oxycodone HCl 10 MG TABS  Debility  Diabetes mellitus without complication ([place]) - Plan: Insulin Glargine (TOUJEO SOLOSTAR) 300 UNIT/ML SOPN, CBC, COMPLETE METABOLIC PANEL WITH GFR, POCT HgB A1C  Morbid obesity with BMI of 50.0-59.9,  adult (HCC)  Hypertension associated with diabetes (HCC)  Stasis dermatitis of both legs  Hypothyroidism, unspecified type - Plan: TSH  Chronic pain syndrome - On anticoagulation, caution with NSAID. Maximized on Cymbalta. Intolerant to gabapentin and Tylenol.  - Plan: morphine (MS CONTIN) 60 MG 12 hr tablet, Oxycodone HCl 10 MG TABS    Patient Instructions  Plan:  Continue current meds  Labs today: may be able to restart Metformin, may not have to restart Potassium   Plan to recheck in 3 months, sooner if needed   If fasting sugars are consistently above 120, may consider increasing Toujeo to 25 units daily     Visit summary with medication list and pertinent instructions was printed for patient to review. All questions at time of visit were answered - patient instructed to contact office with any additional concerns. ER/RTC precautions were reviewed with the patient. Follow-up plan: Return in about 3 months (around 02/18/2018) for recheck for pain medication refills and sugar recheck .  Note: Total time spent 25 minutes, greater than 50% of the visit was spent face-to-face counseling and coordinating care for the following: The primary encounter diagnosis was Chronic pain syndrome. Diagnoses of Spinal stenosis of lumbar region with neurogenic claudication, Pain management, Debility, Diabetes mellitus without complication ([place]), Morbid obesity with BMI of 50.0-59.9, adult ([place]), Hypertension associated with diabetes ([place]), Stasis dermatitis of both legs, Hypothyroidism, unspecified type, and Chronic pain syndrome were also pertinent to this visit.Marland Kitchen  Please note: voice recognition software was used to produce this document, and typos may escape review. Please contact Dr. Sheppard Coil for any needed clarifications.

## 2017-11-20 NOTE — Patient Instructions (Addendum)
Plan:  Continue current meds  Labs today: may be able to restart Metformin, may not have to restart Potassium   Plan to recheck in 3 months, sooner if needed   If fasting sugars are consistently above 120, may consider increasing Toujeo to 25 units daily

## 2017-11-21 ENCOUNTER — Other Ambulatory Visit: Payer: Self-pay | Admitting: Osteopathic Medicine

## 2017-11-21 DIAGNOSIS — D649 Anemia, unspecified: Secondary | ICD-10-CM

## 2017-11-21 LAB — COMPLETE METABOLIC PANEL WITH GFR
AG RATIO: 1.3 (calc) (ref 1.0–2.5)
ALBUMIN MSPROF: 3.4 g/dL — AB (ref 3.6–5.1)
ALKALINE PHOSPHATASE (APISO): 93 U/L (ref 40–115)
ALT: 10 U/L (ref 9–46)
AST: 17 U/L (ref 10–35)
BUN: 25 mg/dL (ref 7–25)
CO2: 25 mmol/L (ref 20–32)
Calcium: 9 mg/dL (ref 8.6–10.3)
Chloride: 107 mmol/L (ref 98–110)
Creat: 0.88 mg/dL (ref 0.70–1.33)
GFR, EST NON AFRICAN AMERICAN: 99 mL/min/{1.73_m2} (ref >=60)
GFR, Est African American: 114 mL/min/{1.73_m2} (ref >=60)
GLOBULIN: 2.6 g/dL (ref 1.9–3.7)
Glucose, Bld: 169 mg/dL — ABNORMAL HIGH (ref 65–99)
POTASSIUM: 4 mmol/L (ref 3.5–5.3)
SODIUM: 141 mmol/L (ref 135–146)
Total Bilirubin: 1.1 mg/dL (ref 0.2–1.2)
Total Protein: 6 g/dL — ABNORMAL LOW (ref 6.1–8.1)

## 2017-11-21 LAB — CBC
HEMATOCRIT: 38.8 % (ref 38.5–50.0)
Hemoglobin: 12.9 g/dL — ABNORMAL LOW (ref 13.2–17.1)
MCH: 28.7 pg (ref 27.0–33.0)
MCHC: 33.2 g/dL (ref 32.0–36.0)
MCV: 86.2 fL (ref 80.0–100.0)
MPV: 10.6 fL (ref 7.5–12.5)
Platelets: 168 10*3/uL (ref 140–400)
RBC: 4.5 10*6/uL (ref 4.20–5.80)
RDW: 15.4 % — AB (ref 11.0–15.0)
WBC: 5.1 10*3/uL (ref 3.8–10.8)

## 2017-11-21 LAB — TSH: TSH: 13.7 m[IU]/L — AB (ref 0.40–4.50)

## 2017-11-21 NOTE — Progress Notes (Signed)
Please call patient re: lab results: he is anemic a little worse than when he was in the hospital. Will add on iron studies.   Thyroid levels indicate dose is low, or he hasn't been taking medication appropriately. He should be taking this early morning before food and 30 minutes prior to other medications. If taking it as directed, we need to increase dose. If not taking as directed, please instruct on proper administration of this medicine as above, and will recheck next visit   Rx order pended in case needed   Lab order for iron studies is in please call lab

## 2017-11-22 NOTE — Progress Notes (Signed)
Spoke with patient and he states he is not feeling well. Advise him of labs ordered and will need to have labs done. Patient is not taking Levothyroxine medication appropriately and will start taking as prescribe. He states he wasn't taking it correctly due to not any more refills and low meds on pain medication. He is not wanting the refills at this time and will contact his pharmacy to see if they will need to refill his Thyroid meds.

## 2017-11-22 NOTE — Progress Notes (Signed)
Okay, if he needs refills, okay to send 90 days supply wherever he needs it sent.

## 2017-11-27 DIAGNOSIS — Z5181 Encounter for therapeutic drug level monitoring: Secondary | ICD-10-CM | POA: Diagnosis not present

## 2017-11-27 DIAGNOSIS — L97521 Non-pressure chronic ulcer of other part of left foot limited to breakdown of skin: Secondary | ICD-10-CM | POA: Diagnosis not present

## 2017-11-27 DIAGNOSIS — L97821 Non-pressure chronic ulcer of other part of left lower leg limited to breakdown of skin: Secondary | ICD-10-CM | POA: Diagnosis not present

## 2017-11-27 DIAGNOSIS — Z794 Long term (current) use of insulin: Secondary | ICD-10-CM | POA: Diagnosis not present

## 2017-11-27 DIAGNOSIS — L97511 Non-pressure chronic ulcer of other part of right foot limited to breakdown of skin: Secondary | ICD-10-CM | POA: Diagnosis not present

## 2017-11-27 DIAGNOSIS — Z7901 Long term (current) use of anticoagulants: Secondary | ICD-10-CM | POA: Diagnosis not present

## 2017-11-27 DIAGNOSIS — Z7982 Long term (current) use of aspirin: Secondary | ICD-10-CM | POA: Diagnosis not present

## 2017-11-27 DIAGNOSIS — F411 Generalized anxiety disorder: Secondary | ICD-10-CM | POA: Diagnosis not present

## 2017-11-27 DIAGNOSIS — I4891 Unspecified atrial fibrillation: Secondary | ICD-10-CM | POA: Diagnosis not present

## 2017-11-27 DIAGNOSIS — E1140 Type 2 diabetes mellitus with diabetic neuropathy, unspecified: Secondary | ICD-10-CM | POA: Diagnosis not present

## 2017-11-27 DIAGNOSIS — I872 Venous insufficiency (chronic) (peripheral): Secondary | ICD-10-CM | POA: Diagnosis not present

## 2017-11-27 DIAGNOSIS — Z79891 Long term (current) use of opiate analgesic: Secondary | ICD-10-CM | POA: Diagnosis not present

## 2017-11-27 DIAGNOSIS — E039 Hypothyroidism, unspecified: Secondary | ICD-10-CM | POA: Diagnosis not present

## 2017-11-27 DIAGNOSIS — I2510 Atherosclerotic heart disease of native coronary artery without angina pectoris: Secondary | ICD-10-CM | POA: Diagnosis not present

## 2017-11-27 DIAGNOSIS — G8929 Other chronic pain: Secondary | ICD-10-CM | POA: Diagnosis not present

## 2017-11-27 DIAGNOSIS — M48061 Spinal stenosis, lumbar region without neurogenic claudication: Secondary | ICD-10-CM | POA: Diagnosis not present

## 2017-11-27 DIAGNOSIS — L97321 Non-pressure chronic ulcer of left ankle limited to breakdown of skin: Secondary | ICD-10-CM | POA: Diagnosis not present

## 2017-11-27 DIAGNOSIS — L97411 Non-pressure chronic ulcer of right heel and midfoot limited to breakdown of skin: Secondary | ICD-10-CM | POA: Diagnosis not present

## 2017-11-27 DIAGNOSIS — I890 Lymphedema, not elsewhere classified: Secondary | ICD-10-CM | POA: Diagnosis not present

## 2017-11-27 DIAGNOSIS — I110 Hypertensive heart disease with heart failure: Secondary | ICD-10-CM | POA: Diagnosis not present

## 2017-11-27 DIAGNOSIS — E1151 Type 2 diabetes mellitus with diabetic peripheral angiopathy without gangrene: Secondary | ICD-10-CM | POA: Diagnosis not present

## 2017-11-27 DIAGNOSIS — Z9181 History of falling: Secondary | ICD-10-CM | POA: Diagnosis not present

## 2017-11-27 DIAGNOSIS — I5022 Chronic systolic (congestive) heart failure: Secondary | ICD-10-CM | POA: Diagnosis not present

## 2017-11-27 DIAGNOSIS — F329 Major depressive disorder, single episode, unspecified: Secondary | ICD-10-CM | POA: Diagnosis not present

## 2017-11-27 DIAGNOSIS — L97811 Non-pressure chronic ulcer of other part of right lower leg limited to breakdown of skin: Secondary | ICD-10-CM | POA: Diagnosis not present

## 2017-12-01 DIAGNOSIS — L97511 Non-pressure chronic ulcer of other part of right foot limited to breakdown of skin: Secondary | ICD-10-CM | POA: Diagnosis not present

## 2017-12-01 DIAGNOSIS — L97821 Non-pressure chronic ulcer of other part of left lower leg limited to breakdown of skin: Secondary | ICD-10-CM | POA: Diagnosis not present

## 2017-12-01 DIAGNOSIS — E1151 Type 2 diabetes mellitus with diabetic peripheral angiopathy without gangrene: Secondary | ICD-10-CM | POA: Diagnosis not present

## 2017-12-01 DIAGNOSIS — L97811 Non-pressure chronic ulcer of other part of right lower leg limited to breakdown of skin: Secondary | ICD-10-CM | POA: Diagnosis not present

## 2017-12-01 DIAGNOSIS — Z7901 Long term (current) use of anticoagulants: Secondary | ICD-10-CM | POA: Diagnosis not present

## 2017-12-01 DIAGNOSIS — I872 Venous insufficiency (chronic) (peripheral): Secondary | ICD-10-CM | POA: Diagnosis not present

## 2017-12-01 DIAGNOSIS — L97321 Non-pressure chronic ulcer of left ankle limited to breakdown of skin: Secondary | ICD-10-CM | POA: Diagnosis not present

## 2017-12-01 DIAGNOSIS — D6851 Activated protein C resistance: Secondary | ICD-10-CM | POA: Diagnosis not present

## 2017-12-01 DIAGNOSIS — Z86711 Personal history of pulmonary embolism: Secondary | ICD-10-CM | POA: Diagnosis not present

## 2017-12-05 DIAGNOSIS — Z86711 Personal history of pulmonary embolism: Secondary | ICD-10-CM | POA: Diagnosis not present

## 2017-12-05 DIAGNOSIS — L97811 Non-pressure chronic ulcer of other part of right lower leg limited to breakdown of skin: Secondary | ICD-10-CM | POA: Diagnosis not present

## 2017-12-05 DIAGNOSIS — L97321 Non-pressure chronic ulcer of left ankle limited to breakdown of skin: Secondary | ICD-10-CM | POA: Diagnosis not present

## 2017-12-05 DIAGNOSIS — Z7901 Long term (current) use of anticoagulants: Secondary | ICD-10-CM | POA: Diagnosis not present

## 2017-12-05 DIAGNOSIS — L97821 Non-pressure chronic ulcer of other part of left lower leg limited to breakdown of skin: Secondary | ICD-10-CM | POA: Diagnosis not present

## 2017-12-05 DIAGNOSIS — D6851 Activated protein C resistance: Secondary | ICD-10-CM | POA: Diagnosis not present

## 2017-12-05 DIAGNOSIS — L97511 Non-pressure chronic ulcer of other part of right foot limited to breakdown of skin: Secondary | ICD-10-CM | POA: Diagnosis not present

## 2017-12-05 DIAGNOSIS — E1151 Type 2 diabetes mellitus with diabetic peripheral angiopathy without gangrene: Secondary | ICD-10-CM | POA: Diagnosis not present

## 2017-12-05 DIAGNOSIS — I872 Venous insufficiency (chronic) (peripheral): Secondary | ICD-10-CM | POA: Diagnosis not present

## 2017-12-08 DIAGNOSIS — L97511 Non-pressure chronic ulcer of other part of right foot limited to breakdown of skin: Secondary | ICD-10-CM | POA: Diagnosis not present

## 2017-12-08 DIAGNOSIS — L97321 Non-pressure chronic ulcer of left ankle limited to breakdown of skin: Secondary | ICD-10-CM | POA: Diagnosis not present

## 2017-12-08 DIAGNOSIS — L97811 Non-pressure chronic ulcer of other part of right lower leg limited to breakdown of skin: Secondary | ICD-10-CM | POA: Diagnosis not present

## 2017-12-08 DIAGNOSIS — L97821 Non-pressure chronic ulcer of other part of left lower leg limited to breakdown of skin: Secondary | ICD-10-CM | POA: Diagnosis not present

## 2017-12-08 DIAGNOSIS — E1151 Type 2 diabetes mellitus with diabetic peripheral angiopathy without gangrene: Secondary | ICD-10-CM | POA: Diagnosis not present

## 2017-12-08 DIAGNOSIS — I872 Venous insufficiency (chronic) (peripheral): Secondary | ICD-10-CM | POA: Diagnosis not present

## 2017-12-11 DIAGNOSIS — L97821 Non-pressure chronic ulcer of other part of left lower leg limited to breakdown of skin: Secondary | ICD-10-CM | POA: Diagnosis not present

## 2017-12-11 DIAGNOSIS — E1151 Type 2 diabetes mellitus with diabetic peripheral angiopathy without gangrene: Secondary | ICD-10-CM | POA: Diagnosis not present

## 2017-12-11 DIAGNOSIS — D6851 Activated protein C resistance: Secondary | ICD-10-CM | POA: Diagnosis not present

## 2017-12-11 DIAGNOSIS — I872 Venous insufficiency (chronic) (peripheral): Secondary | ICD-10-CM | POA: Diagnosis not present

## 2017-12-11 DIAGNOSIS — L97811 Non-pressure chronic ulcer of other part of right lower leg limited to breakdown of skin: Secondary | ICD-10-CM | POA: Diagnosis not present

## 2017-12-11 DIAGNOSIS — L97511 Non-pressure chronic ulcer of other part of right foot limited to breakdown of skin: Secondary | ICD-10-CM | POA: Diagnosis not present

## 2017-12-11 DIAGNOSIS — Z7901 Long term (current) use of anticoagulants: Secondary | ICD-10-CM | POA: Diagnosis not present

## 2017-12-11 DIAGNOSIS — L97321 Non-pressure chronic ulcer of left ankle limited to breakdown of skin: Secondary | ICD-10-CM | POA: Diagnosis not present

## 2017-12-11 DIAGNOSIS — Z86711 Personal history of pulmonary embolism: Secondary | ICD-10-CM | POA: Diagnosis not present

## 2017-12-15 DIAGNOSIS — E1151 Type 2 diabetes mellitus with diabetic peripheral angiopathy without gangrene: Secondary | ICD-10-CM | POA: Diagnosis not present

## 2017-12-15 DIAGNOSIS — L97511 Non-pressure chronic ulcer of other part of right foot limited to breakdown of skin: Secondary | ICD-10-CM | POA: Diagnosis not present

## 2017-12-15 DIAGNOSIS — L97321 Non-pressure chronic ulcer of left ankle limited to breakdown of skin: Secondary | ICD-10-CM | POA: Diagnosis not present

## 2017-12-15 DIAGNOSIS — L97821 Non-pressure chronic ulcer of other part of left lower leg limited to breakdown of skin: Secondary | ICD-10-CM | POA: Diagnosis not present

## 2017-12-15 DIAGNOSIS — L97811 Non-pressure chronic ulcer of other part of right lower leg limited to breakdown of skin: Secondary | ICD-10-CM | POA: Diagnosis not present

## 2017-12-15 DIAGNOSIS — I872 Venous insufficiency (chronic) (peripheral): Secondary | ICD-10-CM | POA: Diagnosis not present

## 2017-12-16 DIAGNOSIS — N62 Hypertrophy of breast: Secondary | ICD-10-CM | POA: Diagnosis not present

## 2017-12-16 DIAGNOSIS — R0602 Shortness of breath: Secondary | ICD-10-CM | POA: Diagnosis not present

## 2017-12-16 DIAGNOSIS — N289 Disorder of kidney and ureter, unspecified: Secondary | ICD-10-CM | POA: Diagnosis not present

## 2017-12-16 DIAGNOSIS — R531 Weakness: Secondary | ICD-10-CM | POA: Diagnosis not present

## 2017-12-16 DIAGNOSIS — G8929 Other chronic pain: Secondary | ICD-10-CM | POA: Diagnosis not present

## 2017-12-16 DIAGNOSIS — K439 Ventral hernia without obstruction or gangrene: Secondary | ICD-10-CM | POA: Diagnosis not present

## 2017-12-16 DIAGNOSIS — I5021 Acute systolic (congestive) heart failure: Secondary | ICD-10-CM | POA: Diagnosis not present

## 2017-12-16 DIAGNOSIS — R112 Nausea with vomiting, unspecified: Secondary | ICD-10-CM | POA: Diagnosis not present

## 2017-12-16 DIAGNOSIS — I771 Stricture of artery: Secondary | ICD-10-CM | POA: Diagnosis not present

## 2017-12-16 DIAGNOSIS — I2510 Atherosclerotic heart disease of native coronary artery without angina pectoris: Secondary | ICD-10-CM | POA: Diagnosis not present

## 2017-12-16 DIAGNOSIS — J90 Pleural effusion, not elsewhere classified: Secondary | ICD-10-CM | POA: Diagnosis not present

## 2017-12-16 DIAGNOSIS — E039 Hypothyroidism, unspecified: Secondary | ICD-10-CM | POA: Diagnosis not present

## 2017-12-16 DIAGNOSIS — K5730 Diverticulosis of large intestine without perforation or abscess without bleeding: Secondary | ICD-10-CM | POA: Diagnosis not present

## 2017-12-16 DIAGNOSIS — R404 Transient alteration of awareness: Secondary | ICD-10-CM | POA: Diagnosis not present

## 2017-12-16 DIAGNOSIS — R05 Cough: Secondary | ICD-10-CM | POA: Diagnosis not present

## 2017-12-16 DIAGNOSIS — I5043 Acute on chronic combined systolic (congestive) and diastolic (congestive) heart failure: Secondary | ICD-10-CM | POA: Diagnosis not present

## 2017-12-17 DIAGNOSIS — G8929 Other chronic pain: Secondary | ICD-10-CM | POA: Diagnosis not present

## 2017-12-17 DIAGNOSIS — R600 Localized edema: Secondary | ICD-10-CM | POA: Diagnosis not present

## 2017-12-17 DIAGNOSIS — I420 Dilated cardiomyopathy: Secondary | ICD-10-CM | POA: Diagnosis not present

## 2017-12-17 DIAGNOSIS — E039 Hypothyroidism, unspecified: Secondary | ICD-10-CM | POA: Diagnosis not present

## 2017-12-17 DIAGNOSIS — I2510 Atherosclerotic heart disease of native coronary artery without angina pectoris: Secondary | ICD-10-CM | POA: Diagnosis not present

## 2017-12-18 DIAGNOSIS — I878 Other specified disorders of veins: Secondary | ICD-10-CM | POA: Diagnosis not present

## 2017-12-18 DIAGNOSIS — I517 Cardiomegaly: Secondary | ICD-10-CM | POA: Diagnosis not present

## 2017-12-18 DIAGNOSIS — Z794 Long term (current) use of insulin: Secondary | ICD-10-CM | POA: Diagnosis not present

## 2017-12-18 DIAGNOSIS — E1169 Type 2 diabetes mellitus with other specified complication: Secondary | ICD-10-CM | POA: Diagnosis not present

## 2017-12-18 DIAGNOSIS — I5043 Acute on chronic combined systolic (congestive) and diastolic (congestive) heart failure: Secondary | ICD-10-CM | POA: Diagnosis not present

## 2017-12-18 DIAGNOSIS — G4733 Obstructive sleep apnea (adult) (pediatric): Secondary | ICD-10-CM | POA: Diagnosis not present

## 2017-12-18 DIAGNOSIS — F331 Major depressive disorder, recurrent, moderate: Secondary | ICD-10-CM | POA: Diagnosis not present

## 2017-12-18 DIAGNOSIS — I083 Combined rheumatic disorders of mitral, aortic and tricuspid valves: Secondary | ICD-10-CM | POA: Diagnosis not present

## 2017-12-18 DIAGNOSIS — D6851 Activated protein C resistance: Secondary | ICD-10-CM | POA: Diagnosis not present

## 2017-12-18 DIAGNOSIS — I10 Essential (primary) hypertension: Secondary | ICD-10-CM | POA: Diagnosis not present

## 2017-12-18 DIAGNOSIS — I77810 Thoracic aortic ectasia: Secondary | ICD-10-CM | POA: Diagnosis not present

## 2017-12-18 DIAGNOSIS — E782 Mixed hyperlipidemia: Secondary | ICD-10-CM | POA: Diagnosis not present

## 2017-12-18 DIAGNOSIS — E1140 Type 2 diabetes mellitus with diabetic neuropathy, unspecified: Secondary | ICD-10-CM | POA: Diagnosis not present

## 2017-12-18 DIAGNOSIS — R600 Localized edema: Secondary | ICD-10-CM | POA: Diagnosis not present

## 2017-12-18 DIAGNOSIS — I255 Ischemic cardiomyopathy: Secondary | ICD-10-CM | POA: Diagnosis not present

## 2017-12-19 DIAGNOSIS — I5043 Acute on chronic combined systolic (congestive) and diastolic (congestive) heart failure: Secondary | ICD-10-CM | POA: Diagnosis not present

## 2017-12-19 DIAGNOSIS — D6851 Activated protein C resistance: Secondary | ICD-10-CM | POA: Diagnosis not present

## 2017-12-19 DIAGNOSIS — I255 Ischemic cardiomyopathy: Secondary | ICD-10-CM | POA: Diagnosis not present

## 2017-12-19 DIAGNOSIS — I10 Essential (primary) hypertension: Secondary | ICD-10-CM | POA: Diagnosis not present

## 2017-12-19 DIAGNOSIS — F331 Major depressive disorder, recurrent, moderate: Secondary | ICD-10-CM | POA: Diagnosis not present

## 2017-12-19 DIAGNOSIS — E782 Mixed hyperlipidemia: Secondary | ICD-10-CM | POA: Diagnosis not present

## 2017-12-19 DIAGNOSIS — Z794 Long term (current) use of insulin: Secondary | ICD-10-CM | POA: Diagnosis not present

## 2017-12-19 DIAGNOSIS — G4733 Obstructive sleep apnea (adult) (pediatric): Secondary | ICD-10-CM | POA: Diagnosis not present

## 2017-12-19 DIAGNOSIS — R600 Localized edema: Secondary | ICD-10-CM | POA: Diagnosis not present

## 2017-12-19 DIAGNOSIS — E1140 Type 2 diabetes mellitus with diabetic neuropathy, unspecified: Secondary | ICD-10-CM | POA: Diagnosis not present

## 2017-12-19 DIAGNOSIS — E1169 Type 2 diabetes mellitus with other specified complication: Secondary | ICD-10-CM | POA: Diagnosis not present

## 2017-12-20 DIAGNOSIS — E782 Mixed hyperlipidemia: Secondary | ICD-10-CM | POA: Diagnosis not present

## 2017-12-20 DIAGNOSIS — G4733 Obstructive sleep apnea (adult) (pediatric): Secondary | ICD-10-CM | POA: Diagnosis not present

## 2017-12-20 DIAGNOSIS — E1140 Type 2 diabetes mellitus with diabetic neuropathy, unspecified: Secondary | ICD-10-CM | POA: Diagnosis not present

## 2017-12-20 DIAGNOSIS — Z794 Long term (current) use of insulin: Secondary | ICD-10-CM | POA: Diagnosis not present

## 2017-12-20 DIAGNOSIS — R600 Localized edema: Secondary | ICD-10-CM | POA: Diagnosis not present

## 2017-12-20 DIAGNOSIS — I255 Ischemic cardiomyopathy: Secondary | ICD-10-CM | POA: Diagnosis not present

## 2017-12-20 DIAGNOSIS — F331 Major depressive disorder, recurrent, moderate: Secondary | ICD-10-CM | POA: Diagnosis not present

## 2017-12-20 DIAGNOSIS — D6851 Activated protein C resistance: Secondary | ICD-10-CM | POA: Diagnosis not present

## 2017-12-20 DIAGNOSIS — E1169 Type 2 diabetes mellitus with other specified complication: Secondary | ICD-10-CM | POA: Diagnosis not present

## 2017-12-20 DIAGNOSIS — I5043 Acute on chronic combined systolic (congestive) and diastolic (congestive) heart failure: Secondary | ICD-10-CM | POA: Diagnosis not present

## 2017-12-20 DIAGNOSIS — I10 Essential (primary) hypertension: Secondary | ICD-10-CM | POA: Diagnosis not present

## 2017-12-22 MED ORDER — WARFARIN SODIUM 1 MG PO TABS
ORAL_TABLET | ORAL | Status: DC
Start: ? — End: 2017-12-22

## 2017-12-22 MED ORDER — ONDANSETRON HCL 4 MG/2ML IJ SOLN
4.00 | INTRAMUSCULAR | Status: DC
Start: ? — End: 2017-12-22

## 2017-12-22 MED ORDER — MORPHINE SULFATE ER 15 MG PO TBCR
60.00 | EXTENDED_RELEASE_TABLET | ORAL | Status: DC
Start: 2017-12-20 — End: 2017-12-22

## 2017-12-22 MED ORDER — ANTACID & ANTIGAS 200-200-20 MG/5ML PO SUSP
30.00 | ORAL | Status: DC
Start: ? — End: 2017-12-22

## 2017-12-22 MED ORDER — MAGNESIUM OXIDE 400 MG PO TABS
400.00 | ORAL_TABLET | ORAL | Status: DC
Start: 2017-12-20 — End: 2017-12-22

## 2017-12-22 MED ORDER — ASPIRIN EC 81 MG PO TBEC
81.00 | DELAYED_RELEASE_TABLET | ORAL | Status: DC
Start: 2017-12-21 — End: 2017-12-22

## 2017-12-22 MED ORDER — AMIODARONE HCL 200 MG PO TABS
200.00 | ORAL_TABLET | ORAL | Status: DC
Start: 2017-12-21 — End: 2017-12-22

## 2017-12-22 MED ORDER — OXYCODONE HCL 10 MG PO TABS
10.00 | ORAL_TABLET | ORAL | Status: DC
Start: ? — End: 2017-12-22

## 2017-12-22 MED ORDER — CARVEDILOL 12.5 MG PO TABS
12.50 | ORAL_TABLET | ORAL | Status: DC
Start: 2017-12-20 — End: 2017-12-22

## 2017-12-22 MED ORDER — GENERIC EXTERNAL MEDICATION
Status: DC
Start: ? — End: 2017-12-22

## 2017-12-22 MED ORDER — POTASSIUM CHLORIDE CRYS ER 20 MEQ PO TBCR
EXTENDED_RELEASE_TABLET | ORAL | Status: DC
Start: 2017-12-20 — End: 2017-12-22

## 2017-12-22 MED ORDER — SODIUM CHLORIDE 0.9 % IV SOLN
INTRAVENOUS | Status: DC
Start: ? — End: 2017-12-22

## 2017-12-22 MED ORDER — LISINOPRIL 5 MG PO TABS
5.00 | ORAL_TABLET | ORAL | Status: DC
Start: 2017-12-21 — End: 2017-12-22

## 2017-12-22 MED ORDER — MAGNESIUM HYDROXIDE 400 MG/5ML PO SUSP
30.00 | ORAL | Status: DC
Start: ? — End: 2017-12-22

## 2017-12-22 MED ORDER — INSULIN LISPRO 100 UNIT/ML ~~LOC~~ SOLN
SUBCUTANEOUS | Status: DC
Start: 2017-12-20 — End: 2017-12-22

## 2017-12-22 MED ORDER — CIPROFLOXACIN HCL 500 MG PO TABS
500.00 | ORAL_TABLET | ORAL | Status: DC
Start: 2017-12-20 — End: 2017-12-22

## 2017-12-22 MED ORDER — BUPROPION HCL ER (XL) 150 MG PO TB24
150.00 | ORAL_TABLET | ORAL | Status: DC
Start: 2017-12-21 — End: 2017-12-22

## 2017-12-22 MED ORDER — DULOXETINE HCL 60 MG PO CPEP
60.00 | ORAL_CAPSULE | ORAL | Status: DC
Start: 2017-12-20 — End: 2017-12-22

## 2017-12-22 MED ORDER — SENNA-DOCUSATE SODIUM 8.6-50 MG PO TABS
ORAL_TABLET | ORAL | Status: DC
Start: ? — End: 2017-12-22

## 2017-12-22 MED ORDER — TORSEMIDE 100 MG PO TABS
100.00 | ORAL_TABLET | ORAL | Status: DC
Start: 2017-12-21 — End: 2017-12-22

## 2017-12-22 MED ORDER — CLOTRIMAZOLE 1 % EX CREA
TOPICAL_CREAM | CUTANEOUS | Status: DC
Start: 2017-12-20 — End: 2017-12-22

## 2017-12-22 MED ORDER — INSULIN LISPRO 100 UNIT/ML ~~LOC~~ SOLN
SUBCUTANEOUS | Status: DC
Start: ? — End: 2017-12-22

## 2017-12-22 MED ORDER — DIPHENHYDRAMINE HCL 25 MG PO CAPS
25.00 | ORAL_CAPSULE | ORAL | Status: DC
Start: ? — End: 2017-12-22

## 2017-12-22 MED ORDER — DIGOXIN 125 MCG PO TABS
ORAL_TABLET | ORAL | Status: DC
Start: 2017-12-20 — End: 2017-12-22

## 2017-12-22 MED ORDER — INSULIN GLARGINE 100 UNIT/ML ~~LOC~~ SOLN
SUBCUTANEOUS | Status: DC
Start: 2017-12-21 — End: 2017-12-22

## 2017-12-22 MED ORDER — LEVOTHYROXINE SODIUM 112 MCG PO TABS
ORAL_TABLET | ORAL | Status: DC
Start: 2017-12-20 — End: 2017-12-22

## 2017-12-22 MED ORDER — ACETAMINOPHEN 325 MG PO TABS
650.00 | ORAL_TABLET | ORAL | Status: DC
Start: ? — End: 2017-12-22

## 2017-12-25 ENCOUNTER — Telehealth: Payer: Self-pay

## 2017-12-25 DIAGNOSIS — G894 Chronic pain syndrome: Secondary | ICD-10-CM

## 2017-12-25 DIAGNOSIS — M48062 Spinal stenosis, lumbar region with neurogenic claudication: Secondary | ICD-10-CM

## 2017-12-25 DIAGNOSIS — R52 Pain, unspecified: Secondary | ICD-10-CM

## 2017-12-25 NOTE — Telephone Encounter (Signed)
Pt requesting medication refills for oxycodone & morphine. As per pt, current oxycodone 10 mg dose is not helping, requesting an increase in dose. Pt would like rxs sent to [facility] in [place]. Pls advise, thanks.

## 2017-12-26 ENCOUNTER — Telehealth: Payer: Self-pay

## 2017-12-26 MED ORDER — OXYCODONE HCL 10 MG PO TABS: 10.0000 mg | ORAL_TABLET | Freq: Two times a day (BID) | ORAL | 0 refills | Status: DC | PRN

## 2017-12-26 MED ORDER — MORPHINE SULFATE ER 60 MG PO TBCR: 60.0000 mg | EXTENDED_RELEASE_TABLET | Freq: Two times a day (BID) | ORAL | 0 refills | Status: DC

## 2017-12-26 NOTE — Telephone Encounter (Signed)
Pt wants both Rx's (Morphine and Oxycodone) sent to [facility] in [place], not [place]. He made this very clear while repeatedly screaming at me over the phone "I don't understand why you guys cant get this right." I advised Pt I understood his request and would get this to the Provider, he continued screaming about how our office "never does anything right." Pt ended phone call by hanging up.   Routing to Provider to resend.   Called [facility], cancelled both Rx's.

## 2017-12-26 NOTE — Telephone Encounter (Signed)
Patient called advised that he does not want any pharmacies listed on his account. Patient stated he is never sure where he may go regarding getting meds. Adv will send a phone message.

## 2017-12-26 NOTE — Telephone Encounter (Signed)
okay

## 2017-12-26 NOTE — Telephone Encounter (Signed)
Sorry, he needs hospital follow up visit to address any issues from hospitalization

## 2017-12-26 NOTE — Telephone Encounter (Signed)
Controlled substance contact is blank for pharmacy as well, no need to update at this time. If this is OK per PCP, that is fine. Otherwise, he needs to pick one pharmacy.

## 2017-12-26 NOTE — Telephone Encounter (Signed)
Attempted to contact patient regarding pcp's note. Unable to leave a vm, "box is full".

## 2017-12-26 NOTE — Telephone Encounter (Signed)
Already done

## 2017-12-26 NOTE — Telephone Encounter (Signed)
Noted.  Understand his frustration but rudeness to staff here will not be tolerated.  Can we call [facility] to make sure they cancel the previous Rx?  I sent to [facility].

## 2017-12-26 NOTE — Telephone Encounter (Signed)
Pt left a vm msg stating that he was in the hospital for 4 days over the holidays and was informed that CT scan showed abnormality in Kidneys. Pt was to have an MRI done, but did not get it done because facilty lacked the equipment to do MRI. Requesting feedback from PCP. Pls advise, thanks!

## 2017-12-26 NOTE — Telephone Encounter (Signed)
I sent his current dose of medications. Increased dose or other change in medicines requires office visit. Has he ever heard back from pain management referral?

## 2017-12-26 NOTE — Telephone Encounter (Signed)
He and I have discussed this, I know he has some financial barriers and we discussed getting everything filled at pharmacies in [place] so the PMP can be reviewed, I would not do mail-order. We'll discuss at his upcoming visit, for now fine to send to [facility]

## 2017-12-26 NOTE — Telephone Encounter (Signed)
As per pt, saw pain mgmt dr in August and was informed that he was not a good candidate for surgery. Pt was informed by dr, that he must get healthier before any treatment for pain can be done. Pt informed of provider's note & will make an appt to be re-evaluated.

## 2017-12-27 ENCOUNTER — Telehealth: Payer: Self-pay | Admitting: Osteopathic Medicine
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
## 2017-12-27 NOTE — Telephone Encounter (Signed)
He has been resistant to assistance in the past, I have tried to reason with him. Next time he is here I will again make the case for assisted living.

## 2017-12-28 DIAGNOSIS — I10 Essential (primary) hypertension: Secondary | ICD-10-CM | POA: Diagnosis not present

## 2017-12-28 DIAGNOSIS — L97811 Non-pressure chronic ulcer of other part of right lower leg limited to breakdown of skin: Secondary | ICD-10-CM | POA: Diagnosis not present

## 2017-12-28 DIAGNOSIS — L97511 Non-pressure chronic ulcer of other part of right foot limited to breakdown of skin: Secondary | ICD-10-CM | POA: Diagnosis not present

## 2017-12-28 DIAGNOSIS — I255 Ischemic cardiomyopathy: Secondary | ICD-10-CM | POA: Diagnosis not present

## 2017-12-28 DIAGNOSIS — R0602 Shortness of breath: Secondary | ICD-10-CM | POA: Diagnosis not present

## 2017-12-28 DIAGNOSIS — L97321 Non-pressure chronic ulcer of left ankle limited to breakdown of skin: Secondary | ICD-10-CM | POA: Diagnosis not present

## 2017-12-28 DIAGNOSIS — I872 Venous insufficiency (chronic) (peripheral): Secondary | ICD-10-CM | POA: Diagnosis not present

## 2017-12-28 DIAGNOSIS — I5022 Chronic systolic (congestive) heart failure: Secondary | ICD-10-CM | POA: Diagnosis not present

## 2017-12-28 DIAGNOSIS — L97821 Non-pressure chronic ulcer of other part of left lower leg limited to breakdown of skin: Secondary | ICD-10-CM | POA: Diagnosis not present

## 2017-12-28 DIAGNOSIS — E1151 Type 2 diabetes mellitus with diabetic peripheral angiopathy without gangrene: Secondary | ICD-10-CM | POA: Diagnosis not present

## 2018-01-01 DIAGNOSIS — L97319 Non-pressure chronic ulcer of right ankle with unspecified severity: Secondary | ICD-10-CM | POA: Diagnosis not present

## 2018-01-01 DIAGNOSIS — Z86718 Personal history of other venous thrombosis and embolism: Secondary | ICD-10-CM | POA: Diagnosis not present

## 2018-01-01 DIAGNOSIS — Z6841 Body Mass Index (BMI) 40.0 and over, adult: Secondary | ICD-10-CM | POA: Diagnosis not present

## 2018-01-01 DIAGNOSIS — E1142 Type 2 diabetes mellitus with diabetic polyneuropathy: Secondary | ICD-10-CM | POA: Diagnosis not present

## 2018-01-01 DIAGNOSIS — L97321 Non-pressure chronic ulcer of left ankle limited to breakdown of skin: Secondary | ICD-10-CM | POA: Diagnosis not present

## 2018-01-01 DIAGNOSIS — E11621 Type 2 diabetes mellitus with foot ulcer: Secondary | ICD-10-CM | POA: Diagnosis not present

## 2018-01-01 DIAGNOSIS — I890 Lymphedema, not elsewhere classified: Secondary | ICD-10-CM | POA: Diagnosis not present

## 2018-01-01 DIAGNOSIS — I509 Heart failure, unspecified: Secondary | ICD-10-CM | POA: Diagnosis not present

## 2018-01-01 DIAGNOSIS — L97811 Non-pressure chronic ulcer of other part of right lower leg limited to breakdown of skin: Secondary | ICD-10-CM | POA: Diagnosis not present

## 2018-01-01 DIAGNOSIS — L97519 Non-pressure chronic ulcer of other part of right foot with unspecified severity: Secondary | ICD-10-CM | POA: Diagnosis not present

## 2018-01-01 DIAGNOSIS — D6851 Activated protein C resistance: Secondary | ICD-10-CM | POA: Diagnosis not present

## 2018-01-01 DIAGNOSIS — L97529 Non-pressure chronic ulcer of other part of left foot with unspecified severity: Secondary | ICD-10-CM | POA: Diagnosis not present

## 2018-01-01 DIAGNOSIS — Z7901 Long term (current) use of anticoagulants: Secondary | ICD-10-CM | POA: Diagnosis not present

## 2018-01-01 DIAGNOSIS — L97821 Non-pressure chronic ulcer of other part of left lower leg limited to breakdown of skin: Secondary | ICD-10-CM | POA: Diagnosis not present

## 2018-01-01 DIAGNOSIS — I872 Venous insufficiency (chronic) (peripheral): Secondary | ICD-10-CM | POA: Diagnosis not present

## 2018-01-01 DIAGNOSIS — I429 Cardiomyopathy, unspecified: Secondary | ICD-10-CM | POA: Diagnosis not present

## 2018-01-01 DIAGNOSIS — Z86711 Personal history of pulmonary embolism: Secondary | ICD-10-CM | POA: Diagnosis not present

## 2018-01-01 DIAGNOSIS — E11622 Type 2 diabetes mellitus with other skin ulcer: Secondary | ICD-10-CM | POA: Diagnosis not present

## 2018-01-02 DIAGNOSIS — Z7901 Long term (current) use of anticoagulants: Secondary | ICD-10-CM | POA: Diagnosis not present

## 2018-01-02 DIAGNOSIS — Z86711 Personal history of pulmonary embolism: Secondary | ICD-10-CM | POA: Diagnosis not present

## 2018-01-02 DIAGNOSIS — D6851 Activated protein C resistance: Secondary | ICD-10-CM | POA: Diagnosis not present

## 2018-01-05 DIAGNOSIS — L97511 Non-pressure chronic ulcer of other part of right foot limited to breakdown of skin: Secondary | ICD-10-CM | POA: Diagnosis not present

## 2018-01-05 DIAGNOSIS — L97811 Non-pressure chronic ulcer of other part of right lower leg limited to breakdown of skin: Secondary | ICD-10-CM | POA: Diagnosis not present

## 2018-01-05 DIAGNOSIS — L97321 Non-pressure chronic ulcer of left ankle limited to breakdown of skin: Secondary | ICD-10-CM | POA: Diagnosis not present

## 2018-01-05 DIAGNOSIS — I872 Venous insufficiency (chronic) (peripheral): Secondary | ICD-10-CM | POA: Diagnosis not present

## 2018-01-05 DIAGNOSIS — E1151 Type 2 diabetes mellitus with diabetic peripheral angiopathy without gangrene: Secondary | ICD-10-CM | POA: Diagnosis not present

## 2018-01-05 DIAGNOSIS — L97821 Non-pressure chronic ulcer of other part of left lower leg limited to breakdown of skin: Secondary | ICD-10-CM | POA: Diagnosis not present

## 2018-01-09 DIAGNOSIS — E1151 Type 2 diabetes mellitus with diabetic peripheral angiopathy without gangrene: Secondary | ICD-10-CM | POA: Diagnosis not present

## 2018-01-09 DIAGNOSIS — L97821 Non-pressure chronic ulcer of other part of left lower leg limited to breakdown of skin: Secondary | ICD-10-CM | POA: Diagnosis not present

## 2018-01-09 DIAGNOSIS — L97321 Non-pressure chronic ulcer of left ankle limited to breakdown of skin: Secondary | ICD-10-CM | POA: Diagnosis not present

## 2018-01-09 DIAGNOSIS — L97511 Non-pressure chronic ulcer of other part of right foot limited to breakdown of skin: Secondary | ICD-10-CM | POA: Diagnosis not present

## 2018-01-09 DIAGNOSIS — L97811 Non-pressure chronic ulcer of other part of right lower leg limited to breakdown of skin: Secondary | ICD-10-CM | POA: Diagnosis not present

## 2018-01-09 DIAGNOSIS — I872 Venous insufficiency (chronic) (peripheral): Secondary | ICD-10-CM | POA: Diagnosis not present

## 2018-01-11 MED ORDER — LEVOTHYROXINE SODIUM 137 MCG PO TABS: 137.0000 ug | ORAL_TABLET | Freq: Every day | ORAL | 0 refills | Status: AC

## 2018-01-16 DIAGNOSIS — L97511 Non-pressure chronic ulcer of other part of right foot limited to breakdown of skin: Secondary | ICD-10-CM | POA: Diagnosis not present

## 2018-01-16 DIAGNOSIS — E1151 Type 2 diabetes mellitus with diabetic peripheral angiopathy without gangrene: Secondary | ICD-10-CM | POA: Diagnosis not present

## 2018-01-16 DIAGNOSIS — L97821 Non-pressure chronic ulcer of other part of left lower leg limited to breakdown of skin: Secondary | ICD-10-CM | POA: Diagnosis not present

## 2018-01-16 DIAGNOSIS — D6851 Activated protein C resistance: Secondary | ICD-10-CM | POA: Diagnosis not present

## 2018-01-16 DIAGNOSIS — L97321 Non-pressure chronic ulcer of left ankle limited to breakdown of skin: Secondary | ICD-10-CM | POA: Diagnosis not present

## 2018-01-16 DIAGNOSIS — Z7901 Long term (current) use of anticoagulants: Secondary | ICD-10-CM | POA: Diagnosis not present

## 2018-01-16 DIAGNOSIS — Z86711 Personal history of pulmonary embolism: Secondary | ICD-10-CM | POA: Diagnosis not present

## 2018-01-16 DIAGNOSIS — I872 Venous insufficiency (chronic) (peripheral): Secondary | ICD-10-CM | POA: Diagnosis not present

## 2018-01-16 DIAGNOSIS — L97811 Non-pressure chronic ulcer of other part of right lower leg limited to breakdown of skin: Secondary | ICD-10-CM | POA: Diagnosis not present

## 2018-01-23 ENCOUNTER — Telehealth: Payer: Self-pay | Admitting: Osteopathic Medicine

## 2018-01-23 DIAGNOSIS — M48062 Spinal stenosis, lumbar region with neurogenic claudication: Secondary | ICD-10-CM

## 2018-01-23 DIAGNOSIS — G894 Chronic pain syndrome: Secondary | ICD-10-CM

## 2018-01-23 DIAGNOSIS — R52 Pain, unspecified: Secondary | ICD-10-CM

## 2018-01-23 MED ORDER — OXYCODONE HCL 10 MG PO TABS: 10.0000 mg | ORAL_TABLET | Freq: Two times a day (BID) | ORAL | 0 refills | Status: DC | PRN

## 2018-01-23 MED ORDER — MORPHINE SULFATE ER 60 MG PO TBCR: 60.0000 mg | EXTENDED_RELEASE_TABLET | Freq: Two times a day (BID) | ORAL | 0 refills | Status: DC

## 2018-01-23 NOTE — Telephone Encounter (Signed)
Note: PMP reviewed, last fill for 30 days supply 12/26/2017  Can call patient: refills sent. He is still encouraged to come in to the office for hospitalization follow-up in the next few weeks.

## 2018-01-23 NOTE — Telephone Encounter (Signed)
Called patient and let him know prescriptions were sent to the pharmacy. He scheduled a hfu for Feb. 19th. Thanks

## 2018-01-23 NOTE — Telephone Encounter (Signed)
Pt called and stated he needs a refill on his oxycodone and morphine. He said he contacted the pharmacy but stated since it was a narcotic/controlled substance he needed to call us. Patient has a appointment with you in march. Thanks

## 2018-01-25 DIAGNOSIS — E1151 Type 2 diabetes mellitus with diabetic peripheral angiopathy without gangrene: Secondary | ICD-10-CM | POA: Diagnosis not present

## 2018-01-25 DIAGNOSIS — I2510 Atherosclerotic heart disease of native coronary artery without angina pectoris: Secondary | ICD-10-CM | POA: Diagnosis not present

## 2018-01-25 DIAGNOSIS — L97511 Non-pressure chronic ulcer of other part of right foot limited to breakdown of skin: Secondary | ICD-10-CM | POA: Diagnosis not present

## 2018-01-25 DIAGNOSIS — L97821 Non-pressure chronic ulcer of other part of left lower leg limited to breakdown of skin: Secondary | ICD-10-CM | POA: Diagnosis not present

## 2018-01-25 DIAGNOSIS — L97811 Non-pressure chronic ulcer of other part of right lower leg limited to breakdown of skin: Secondary | ICD-10-CM | POA: Diagnosis not present

## 2018-01-25 DIAGNOSIS — I872 Venous insufficiency (chronic) (peripheral): Secondary | ICD-10-CM | POA: Diagnosis not present

## 2018-01-25 DIAGNOSIS — E039 Hypothyroidism, unspecified: Secondary | ICD-10-CM | POA: Diagnosis not present

## 2018-01-25 DIAGNOSIS — I5022 Chronic systolic (congestive) heart failure: Secondary | ICD-10-CM | POA: Diagnosis not present

## 2018-01-25 DIAGNOSIS — I10 Essential (primary) hypertension: Secondary | ICD-10-CM | POA: Diagnosis not present

## 2018-01-25 DIAGNOSIS — I255 Ischemic cardiomyopathy: Secondary | ICD-10-CM | POA: Diagnosis not present

## 2018-01-25 DIAGNOSIS — L97321 Non-pressure chronic ulcer of left ankle limited to breakdown of skin: Secondary | ICD-10-CM | POA: Diagnosis not present

## 2018-01-26 DIAGNOSIS — L97811 Non-pressure chronic ulcer of other part of right lower leg limited to breakdown of skin: Secondary | ICD-10-CM | POA: Diagnosis not present

## 2018-01-26 DIAGNOSIS — M48061 Spinal stenosis, lumbar region without neurogenic claudication: Secondary | ICD-10-CM | POA: Diagnosis not present

## 2018-01-26 DIAGNOSIS — I872 Venous insufficiency (chronic) (peripheral): Secondary | ICD-10-CM | POA: Diagnosis not present

## 2018-01-26 DIAGNOSIS — L97521 Non-pressure chronic ulcer of other part of left foot limited to breakdown of skin: Secondary | ICD-10-CM | POA: Diagnosis not present

## 2018-01-26 DIAGNOSIS — L97411 Non-pressure chronic ulcer of right heel and midfoot limited to breakdown of skin: Secondary | ICD-10-CM | POA: Diagnosis not present

## 2018-01-26 DIAGNOSIS — Z5181 Encounter for therapeutic drug level monitoring: Secondary | ICD-10-CM | POA: Diagnosis not present

## 2018-01-26 DIAGNOSIS — Z794 Long term (current) use of insulin: Secondary | ICD-10-CM | POA: Diagnosis not present

## 2018-01-26 DIAGNOSIS — I890 Lymphedema, not elsewhere classified: Secondary | ICD-10-CM | POA: Diagnosis not present

## 2018-01-26 DIAGNOSIS — Z7901 Long term (current) use of anticoagulants: Secondary | ICD-10-CM | POA: Diagnosis not present

## 2018-01-26 DIAGNOSIS — G8929 Other chronic pain: Secondary | ICD-10-CM | POA: Diagnosis not present

## 2018-01-26 DIAGNOSIS — E1165 Type 2 diabetes mellitus with hyperglycemia: Secondary | ICD-10-CM | POA: Diagnosis not present

## 2018-01-26 DIAGNOSIS — I2510 Atherosclerotic heart disease of native coronary artery without angina pectoris: Secondary | ICD-10-CM | POA: Diagnosis not present

## 2018-01-26 DIAGNOSIS — L97511 Non-pressure chronic ulcer of other part of right foot limited to breakdown of skin: Secondary | ICD-10-CM | POA: Diagnosis not present

## 2018-01-26 DIAGNOSIS — I5042 Chronic combined systolic (congestive) and diastolic (congestive) heart failure: Secondary | ICD-10-CM | POA: Diagnosis not present

## 2018-01-26 DIAGNOSIS — L97821 Non-pressure chronic ulcer of other part of left lower leg limited to breakdown of skin: Secondary | ICD-10-CM | POA: Diagnosis not present

## 2018-01-26 DIAGNOSIS — E1151 Type 2 diabetes mellitus with diabetic peripheral angiopathy without gangrene: Secondary | ICD-10-CM | POA: Diagnosis not present

## 2018-01-26 DIAGNOSIS — G4733 Obstructive sleep apnea (adult) (pediatric): Secondary | ICD-10-CM | POA: Diagnosis not present

## 2018-01-26 DIAGNOSIS — F411 Generalized anxiety disorder: Secondary | ICD-10-CM | POA: Diagnosis not present

## 2018-01-26 DIAGNOSIS — I110 Hypertensive heart disease with heart failure: Secondary | ICD-10-CM | POA: Diagnosis not present

## 2018-01-26 DIAGNOSIS — E039 Hypothyroidism, unspecified: Secondary | ICD-10-CM | POA: Diagnosis not present

## 2018-01-26 DIAGNOSIS — E1140 Type 2 diabetes mellitus with diabetic neuropathy, unspecified: Secondary | ICD-10-CM | POA: Diagnosis not present

## 2018-01-26 DIAGNOSIS — I4891 Unspecified atrial fibrillation: Secondary | ICD-10-CM | POA: Diagnosis not present

## 2018-01-26 DIAGNOSIS — Z6841 Body Mass Index (BMI) 40.0 and over, adult: Secondary | ICD-10-CM | POA: Diagnosis not present

## 2018-01-26 DIAGNOSIS — F329 Major depressive disorder, single episode, unspecified: Secondary | ICD-10-CM | POA: Diagnosis not present

## 2018-01-30 DIAGNOSIS — L97811 Non-pressure chronic ulcer of other part of right lower leg limited to breakdown of skin: Secondary | ICD-10-CM | POA: Diagnosis not present

## 2018-01-30 DIAGNOSIS — Z7901 Long term (current) use of anticoagulants: Secondary | ICD-10-CM | POA: Diagnosis not present

## 2018-01-30 DIAGNOSIS — L97821 Non-pressure chronic ulcer of other part of left lower leg limited to breakdown of skin: Secondary | ICD-10-CM | POA: Diagnosis not present

## 2018-01-30 DIAGNOSIS — D6851 Activated protein C resistance: Secondary | ICD-10-CM | POA: Diagnosis not present

## 2018-01-30 DIAGNOSIS — I872 Venous insufficiency (chronic) (peripheral): Secondary | ICD-10-CM | POA: Diagnosis not present

## 2018-01-30 DIAGNOSIS — L97521 Non-pressure chronic ulcer of other part of left foot limited to breakdown of skin: Secondary | ICD-10-CM | POA: Diagnosis not present

## 2018-01-30 DIAGNOSIS — E1151 Type 2 diabetes mellitus with diabetic peripheral angiopathy without gangrene: Secondary | ICD-10-CM | POA: Diagnosis not present

## 2018-01-30 DIAGNOSIS — L97511 Non-pressure chronic ulcer of other part of right foot limited to breakdown of skin: Secondary | ICD-10-CM | POA: Diagnosis not present

## 2018-01-30 DIAGNOSIS — Z86711 Personal history of pulmonary embolism: Secondary | ICD-10-CM | POA: Diagnosis not present

## 2018-01-31 DIAGNOSIS — L97521 Non-pressure chronic ulcer of other part of left foot limited to breakdown of skin: Secondary | ICD-10-CM | POA: Diagnosis not present

## 2018-01-31 DIAGNOSIS — I872 Venous insufficiency (chronic) (peripheral): Secondary | ICD-10-CM | POA: Diagnosis not present

## 2018-01-31 DIAGNOSIS — L97821 Non-pressure chronic ulcer of other part of left lower leg limited to breakdown of skin: Secondary | ICD-10-CM | POA: Diagnosis not present

## 2018-01-31 DIAGNOSIS — E1151 Type 2 diabetes mellitus with diabetic peripheral angiopathy without gangrene: Secondary | ICD-10-CM | POA: Diagnosis not present

## 2018-01-31 DIAGNOSIS — L97811 Non-pressure chronic ulcer of other part of right lower leg limited to breakdown of skin: Secondary | ICD-10-CM | POA: Diagnosis not present

## 2018-01-31 DIAGNOSIS — L97511 Non-pressure chronic ulcer of other part of right foot limited to breakdown of skin: Secondary | ICD-10-CM | POA: Diagnosis not present

## 2018-02-06 ENCOUNTER — Other Ambulatory Visit: Payer: Self-pay | Admitting: Osteopathic Medicine

## 2018-02-06 ENCOUNTER — Ambulatory Visit (INDEPENDENT_AMBULATORY_CARE_PROVIDER_SITE_OTHER): Payer: Medicare Other | Admitting: Osteopathic Medicine

## 2018-02-06 ENCOUNTER — Encounter: Payer: Self-pay | Admitting: Osteopathic Medicine

## 2018-02-06 DIAGNOSIS — I5020 Unspecified systolic (congestive) heart failure: Secondary | ICD-10-CM

## 2018-02-06 DIAGNOSIS — Z6841 Body Mass Index (BMI) 40.0 and over, adult: Secondary | ICD-10-CM

## 2018-02-06 DIAGNOSIS — I420 Dilated cardiomyopathy: Secondary | ICD-10-CM

## 2018-02-06 DIAGNOSIS — N2889 Other specified disorders of kidney and ureter: Secondary | ICD-10-CM | POA: Diagnosis not present

## 2018-02-06 DIAGNOSIS — E039 Hypothyroidism, unspecified: Secondary | ICD-10-CM | POA: Diagnosis not present

## 2018-02-06 DIAGNOSIS — G894 Chronic pain syndrome: Secondary | ICD-10-CM | POA: Diagnosis not present

## 2018-02-06 DIAGNOSIS — I10 Essential (primary) hypertension: Secondary | ICD-10-CM

## 2018-02-06 DIAGNOSIS — M48062 Spinal stenosis, lumbar region with neurogenic claudication: Secondary | ICD-10-CM

## 2018-02-06 MED ORDER — BUPROPION HCL ER (XL) 150 MG PO TB24: 150.0000 mg | ORAL_TABLET | Freq: Every day | ORAL | 0 refills | Status: AC

## 2018-02-06 MED ORDER — MORPHINE SULFATE ER 60 MG PO TBCR: 60.0000 mg | EXTENDED_RELEASE_TABLET | Freq: Two times a day (BID) | ORAL | 0 refills | Status: AC

## 2018-02-06 MED ORDER — OXYCODONE HCL 10 MG PO TABS: 10.0000 mg | ORAL_TABLET | Freq: Two times a day (BID) | ORAL | 0 refills | Status: AC | PRN

## 2018-02-06 NOTE — Patient Instructions (Addendum)
Plan:   Recommend following up with pain management to discuss stimulator versus other pain treatments.   I'm at my maximum comfort level as far as medications are concerned, if we were to go up on the dose, I'd rather have pain management give their recommendation first.   Medications refilled as-is for now, will fill date 30 days from last prescription    Let's try getting back on the Wellbutrin to help moods. This medicine often heps with weight loss as well.   Will schedule MRI to evaluate the right sided kidney mass.   Will plan to get back on insulin, start with Toujeo 20 units daily and increase as follows:   Measure fasting blood sugar every day: goal for now is to get this to 80-130  Increase daily Insulin dose by 3 units at a time, twice per week, until fasting sugars are consistently 80-130, then continue at that dose  Plan to follow-up in the office ASAP if you experience low sugars   Plan to follow-up in the office if your fasting sugars are consistently high  Bring all sugar readings with you to your office visits

## 2018-02-06 NOTE — Progress Notes (Signed)
Lab order placed per radiology protocol.  

## 2018-02-06 NOTE — Progress Notes (Signed)
HPI: Mark Dean is a 52 y.o. male who  has no past medical history on file.  he presents to [facility] today, 02/06/18,  for chief complaint of: Hospital follow-up - CHF, other chronic issues   Hospitalized 12/16/17-12/30/17 (4 days) for acute on chronic S/D CHF NYHA3 EF<25%, PVD, DM2. Presented to ER w/ SOB and dx CHF exacerbation.  CHF: Echo in hospital continued to show EF 25%, dilated RVC and L/R Atria, Dilated LV and RV. Seen by cardio 01/25/18: Torsemide continued and BMP was fine for renal function, emphasized salt restriction and exercise. No mention of ICD placement though this was brought up in the hospital D/C summary   Hypothyroid: recently started taking his synthroid again after 2-3 weeks lapse d/t forgetting to put it into pill bottles. Last TSH 12/30/17 was 16.3.   DM2: out of Toujeo right now, has been out of this for about 2 weeks.   R kidney lesion on CT, requiring outpatient MRI but this was unable to be scheduled in the hospital. CT Abd/Pelv w/o: non-simple R renal lesion 8 cm increased from 2017.   Pain mgt: discussed stimulator w/ pain mgt, he feels he's been unable to lose weight d/t pain. He's taking MS Contin 60 mg bid w/ Oxycodone 10 mg bid for breakthrough.      Past medical, surgical, social and family history reviewed:  Patient Active Problem List   Diagnosis Date Noted  . Pain management 08/11/2017  . Hypogonadism 04/15/2017  . Atypical atrial flutter ([place]) 02/22/2017  . Ischemic cardiomyopathy 02/22/2017  . S/P coronary artery stent placement 02/22/2017  . Acute bilateral deep vein thrombosis (DVT) of femoral veins ([place]) 02/21/2017  . Acute renal failure with acute cortical necrosis ([place]) 02/21/2017  . Chronic diarrhea 02/17/2017  . Debility 02/17/2017  . Depression, recurrent ([place]) 02/10/2017  . Other chronic pain 02/10/2017  . History of pulmonary embolism 02/10/2017  . Acquired hypothyroidism 02/10/2017   . Uncontrolled type 2 diabetes mellitus with complication, with long-term current use of insulin ([place]) 02/10/2017  . Cellulitis 04/18/2016  . Bilateral diabetic foot ulcer associated with secondary diabetes mellitus ([place]) 01/25/2016  . Edema extremities 12/07/2015  . QT prolongation 12/07/2015  . MDD (major depressive disorder), recurrent severe, without psychosis ([place]) 11/19/2015  . Dehiscence of surgical wound 11/09/2015  . Delayed surgical wound healing 11/03/2015  . Secondary lymphedema 11/03/2015  . Acute on chronic systolic heart failure ([place]) 10/23/2015  . Volume overload 10/23/2015  . S/P right knee surgery 10/19/2015  . Chronic venous insufficiency 12/23/2014  . PVD (peripheral vascular disease) ([place]) 12/23/2014  . Onychomycosis due to dermatophyte 09/12/2013  . Enthesopathy of foot 09/12/2013  . Keratosis 09/12/2013  . Depression, major 09/03/2013  . Generalized anxiety disorder 09/01/2013  . Bilateral leg pain 07/22/2013  . DVT, femoral, chronic ([place]) 07/10/2013  . Stasis dermatitis of both legs 07/10/2013  . Lumbar stenosis 09/21/2012  . Spinal stenosis of lumbar region 09/21/2012  . CAD (coronary artery disease) 09/16/2012  . Essential hypertension 11/21/2011  . Obstructive sleep apnea syndrome in adult 11/21/2011  . Pulmonary embolism ([place]) 11/21/2011  . Heterozygous factor V Leiden mutation ([place]) 11/17/2011  . Lymphedema of lower extremity 10/19/2011  . Atherosclerosis of native coronary artery of native heart without angina pectoris 02/22/2011  . Dilated cardiomyopathy ([place]) 02/22/2011  . Mixed diabetic hyperlipidemia associated with type 2 diabetes mellitus ([place]) 02/22/2011  . Morbid obesity with BMI of 50.0-59.9, adult ([place]) 07/01/2009    No  past surgical history on file.  Social History   Tobacco Use  . Smoking status: Unknown If Ever Smoked  . Smokeless tobacco: Never Used  Substance Use Topics  . Alcohol use: Not on file    No family history on  file.   Current medication list and allergy/intolerance information reviewed:    Current Outpatient Medications  Medication Sig Dispense Refill  . amiodarone (PACERONE) 200 MG tablet Take 200 mg by mouth daily.    Marland Kitchen aspirin EC 81 MG tablet Take 81 mg by mouth daily.    . blood glucose meter kit and supplies KIT Dispense based on patient and insurance preference. Use up to four times daily as directed. Please include lancets, test strips, control solution. 1 each 99  . carvedilol (COREG) 6.25 MG tablet Take 6.25 mg by mouth daily.    . digoxin (LANOXIN) 0.125 MG tablet Take 125 mcg by mouth every other day.    . docusate sodium (COLACE) 100 MG capsule Take 1 capsule (100 mg total) by mouth 2 (two) times daily. 60 capsule 1  . DULoxetine (CYMBALTA) 60 MG capsule Take 1 capsule (60 mg total) by mouth 2 (two) times daily. 180 capsule 1  . glucose blood test strip Use up to 4 times per day as directed with glucometer. Disp: 100. Refill x99 100 each 99  . hydrALAZINE (APRESOLINE) 25 MG tablet Take 25 mg by mouth 2 (two) times daily.    . Insulin Glargine (TOUJEO SOLOSTAR) 300 UNIT/ML SOPN Inject 20 Units into the skin daily. 3 mL 6  . Insulin Pen Needle 29G X 12MM MISC Use with Toujeo pen as directed 100 each 99  . Lancet Device MISC Use up to 4 times per day as directed with glucometer. Disp: 100. Refill x99 100 each 99  . levothyroxine (SYNTHROID, LEVOTHROID) 137 MCG tablet Take 1 tablet (137 mcg total) by mouth daily before breakfast. Plan to repeat labs approx 06/07/17 90 tablet 0  . lisinopril (PRINIVIL,ZESTRIL) 5 MG tablet Take 5 mg by mouth daily.    . metFORMIN (GLUCOPHAGE) 1000 MG tablet Take 1 tablet (1,000 mg total) by mouth 2 (two) times daily with a meal. (Patient not taking: Reported on 11/20/2017) 180 tablet 1  . morphine (MS CONTIN) 60 MG 12 hr tablet Take 1 tablet (60 mg total) by mouth every 12 (twelve) hours. #60 tablets for 30 days 60 tablet 0  . Oxycodone HCl 10 MG TABS Take 1  tablet (10 mg total) by mouth every 12 (twelve) hours as needed (breakthrough pain). In addition to MS Contin. #60 for 30 days 60 tablet 0  . potassium chloride SA (K-DUR,KLOR-CON) 20 MEQ tablet Take 20 mEq by mouth daily.    Marland Kitchen torsemide (DEMADEX) 100 MG tablet Take 50 mg by mouth daily.      No current facility-administered medications for this visit.     No Known Allergies    Review of Systems:  Constitutional:  No  fever, no chills, +recent illness, No unintentional weight changes. +chronic significant fatigue.   HEENT: No  headache, no vision change, no hearing change  Cardiac: No  chest pain, No  pressure, No palpitations, No  Orthopnea  Respiratory:  No  shortness of breath. No  Cough  Gastrointestinal: No  abdominal pain, No  nausea  Musculoskeletal: No new myalgia/arthralgia  Skin: No new Rash  Endocrine: No cold intolerance,  No heat intolerance. No polyuria/polydipsia/polyphagia   Neurologic: No  weakness, No  dizziness  Psychiatric: +concerns  with depression, No  concerns with anxiety, No sleep problems, No mood problems  Exam:  BP 137/62   Pulse 87   Temp 98.1 F (36.7 C) (Oral)   Constitutional: VS see above. General Appearance: alert, well-developed, well-nourished, NAD  Eyes: Normal lids and conjunctive, non-icteric sclera  Ears, Nose, Mouth, Throat: MMM, Normal external inspection ears/nares/mouth/lips/gums.   Neck: No masses, trachea midline.  Respiratory: Normal respiratory effort. no wheeze, no rhonchi, no rales  Cardiovascular: S1/S2 normal, no murmur, no rub/gallop auscultated. RRR.  Musculoskeletal: pt declines to move from the wheelchair   Neurological: Normal balance/coordination. No tremor.  Skin: warm, dry on limited exam .    Psychiatric: Normal judgment/insight. Normal mood and affect. Oriented x3.      ASSESSMENT/PLAN:   Chronic pain syndrome - Advise revisit with pain management, I am not comfortable going up further on the  dose of this medications - Plan: morphine (MS CONTIN) 60 MG 12 hr tablet, Oxycodone HCl 10 MG TABS  Spinal stenosis of lumbar region with neurogenic claudication - Plan: morphine (MS CONTIN) 60 MG 12 hr tablet, Oxycodone HCl 10 MG TABS  Morbid obesity with BMI of 50.0-59.9, adult (HCC) - His musculoskeletal conditions and other comorbidities limit exercise tolerance. Severe CHF would certainly be a precaution for bariatrics. Diet emphasized  - Plan: MR ABDOMEN W WO CONTRAST  NYHA class 3 heart failure with reduced ejection fraction ([place]) - Following with cardiology.  Hypothyroidism, unspecified type - Hopefully will improve once back on medications, will defer TSH recheck for now  Dilated cardiomyopathy ([place])  Renal mass, right - Incidental finding on CT, will get further evaluation with MR - Plan: MR ABDOMEN W WO CONTRAST    Patient Instructions  Plan:   Recommend following up with pain management to discuss stimulator versus other pain treatments.   I'm at my maximum comfort level as far as medications are concerned, if we were to go up on the dose, I'd rather have pain management give their recommendation first.   Medications refilled as-is for now, will fill date 30 days from last prescription    Let's try getting back on the Wellbutrin to help moods. This medicine often heps with weight loss as well.   Will schedule MRI to evaluate the right sided kidney mass.   Will plan to get back on insulin, start with Toujeo 20 units daily and increase as follows:   Measure fasting blood sugar every day: goal for now is to get this to 80-130  Increase daily Insulin dose by 3 units at a time, twice per week, until fasting sugars are consistently 80-130, then continue at that dose  Plan to follow-up in the office ASAP if you experience low sugars   Plan to follow-up in the office if your fasting sugars are consistently high  Bring all sugar readings with you to your office  visits     Visit summary with medication list and pertinent instructions was printed for patient to review. All questions at time of visit were answered - patient instructed to contact office with any additional concerns. ER/RTC precautions were reviewed with the patient.   Follow-up plan: Return in about 6 weeks (around 03/20/2018) for recheck A1C (sugars), sooner if needed .  Note: Total time spent 40 minutes, greater than 50% of the visit was spent face-to-face counseling and coordinating care for the following: Diagnoses of Chronic pain syndrome, Spinal stenosis of lumbar region with neurogenic claudication, Morbid obesity with BMI of 50.0-59.9, adult (  [place]), NYHA class 3 heart failure with reduced ejection fraction ([place]), Hypothyroidism, unspecified type, Dilated cardiomyopathy ([place]), and Renal mass, right were pertinent to this visit.Marland Kitchen  Please note: voice recognition software was used to produce this document, and typos may escape review. Please contact Dr. Sheppard Coil for any needed clarifications.

## 2018-02-07 DIAGNOSIS — N2889 Other specified disorders of kidney and ureter: Secondary | ICD-10-CM | POA: Insufficient documentation

## 2018-02-08 DIAGNOSIS — I872 Venous insufficiency (chronic) (peripheral): Secondary | ICD-10-CM | POA: Diagnosis not present

## 2018-02-08 DIAGNOSIS — I739 Peripheral vascular disease, unspecified: Secondary | ICD-10-CM | POA: Diagnosis not present

## 2018-02-08 DIAGNOSIS — I890 Lymphedema, not elsewhere classified: Secondary | ICD-10-CM | POA: Diagnosis not present

## 2018-02-08 DIAGNOSIS — Z7901 Long term (current) use of anticoagulants: Secondary | ICD-10-CM | POA: Diagnosis not present

## 2018-02-08 DIAGNOSIS — I255 Ischemic cardiomyopathy: Secondary | ICD-10-CM | POA: Diagnosis not present

## 2018-02-08 DIAGNOSIS — L97921 Non-pressure chronic ulcer of unspecified part of left lower leg limited to breakdown of skin: Secondary | ICD-10-CM | POA: Diagnosis not present

## 2018-02-08 DIAGNOSIS — I10 Essential (primary) hypertension: Secondary | ICD-10-CM | POA: Diagnosis not present

## 2018-02-13 DIAGNOSIS — Z86711 Personal history of pulmonary embolism: Secondary | ICD-10-CM | POA: Diagnosis not present

## 2018-02-13 DIAGNOSIS — Z7901 Long term (current) use of anticoagulants: Secondary | ICD-10-CM | POA: Diagnosis not present

## 2018-02-13 DIAGNOSIS — D6851 Activated protein C resistance: Secondary | ICD-10-CM | POA: Diagnosis not present

## 2018-02-16 DIAGNOSIS — L97821 Non-pressure chronic ulcer of other part of left lower leg limited to breakdown of skin: Secondary | ICD-10-CM | POA: Diagnosis not present

## 2018-02-16 DIAGNOSIS — I872 Venous insufficiency (chronic) (peripheral): Secondary | ICD-10-CM | POA: Diagnosis not present

## 2018-02-16 DIAGNOSIS — E1151 Type 2 diabetes mellitus with diabetic peripheral angiopathy without gangrene: Secondary | ICD-10-CM | POA: Diagnosis not present

## 2018-02-16 DIAGNOSIS — L97811 Non-pressure chronic ulcer of other part of right lower leg limited to breakdown of skin: Secondary | ICD-10-CM | POA: Diagnosis not present

## 2018-02-16 DIAGNOSIS — L97521 Non-pressure chronic ulcer of other part of left foot limited to breakdown of skin: Secondary | ICD-10-CM | POA: Diagnosis not present

## 2018-02-16 DIAGNOSIS — L97511 Non-pressure chronic ulcer of other part of right foot limited to breakdown of skin: Secondary | ICD-10-CM | POA: Diagnosis not present

## 2018-02-19 ENCOUNTER — Ambulatory Visit: Payer: Medicare Other | Admitting: Osteopathic Medicine

## 2018-02-23 DIAGNOSIS — L97521 Non-pressure chronic ulcer of other part of left foot limited to breakdown of skin: Secondary | ICD-10-CM | POA: Diagnosis not present

## 2018-02-23 DIAGNOSIS — L97811 Non-pressure chronic ulcer of other part of right lower leg limited to breakdown of skin: Secondary | ICD-10-CM | POA: Diagnosis not present

## 2018-02-23 DIAGNOSIS — E1151 Type 2 diabetes mellitus with diabetic peripheral angiopathy without gangrene: Secondary | ICD-10-CM | POA: Diagnosis not present

## 2018-02-23 DIAGNOSIS — I872 Venous insufficiency (chronic) (peripheral): Secondary | ICD-10-CM | POA: Diagnosis not present

## 2018-02-23 DIAGNOSIS — L97821 Non-pressure chronic ulcer of other part of left lower leg limited to breakdown of skin: Secondary | ICD-10-CM | POA: Diagnosis not present

## 2018-02-23 DIAGNOSIS — L97511 Non-pressure chronic ulcer of other part of right foot limited to breakdown of skin: Secondary | ICD-10-CM | POA: Diagnosis not present

## 2018-02-27 DIAGNOSIS — L97511 Non-pressure chronic ulcer of other part of right foot limited to breakdown of skin: Secondary | ICD-10-CM | POA: Diagnosis not present

## 2018-02-27 DIAGNOSIS — L97521 Non-pressure chronic ulcer of other part of left foot limited to breakdown of skin: Secondary | ICD-10-CM | POA: Diagnosis not present

## 2018-02-27 DIAGNOSIS — L97821 Non-pressure chronic ulcer of other part of left lower leg limited to breakdown of skin: Secondary | ICD-10-CM | POA: Diagnosis not present

## 2018-02-27 DIAGNOSIS — I872 Venous insufficiency (chronic) (peripheral): Secondary | ICD-10-CM | POA: Diagnosis not present

## 2018-02-27 DIAGNOSIS — L97811 Non-pressure chronic ulcer of other part of right lower leg limited to breakdown of skin: Secondary | ICD-10-CM | POA: Diagnosis not present

## 2018-02-27 DIAGNOSIS — E1151 Type 2 diabetes mellitus with diabetic peripheral angiopathy without gangrene: Secondary | ICD-10-CM | POA: Diagnosis not present

## 2018-02-28 DIAGNOSIS — I10 Essential (primary) hypertension: Secondary | ICD-10-CM | POA: Diagnosis not present

## 2018-03-01 DIAGNOSIS — Z86711 Personal history of pulmonary embolism: Secondary | ICD-10-CM | POA: Diagnosis not present

## 2018-03-01 DIAGNOSIS — Z7901 Long term (current) use of anticoagulants: Secondary | ICD-10-CM | POA: Diagnosis not present

## 2018-03-01 DIAGNOSIS — D6851 Activated protein C resistance: Secondary | ICD-10-CM | POA: Diagnosis not present

## 2018-03-01 LAB — COMPLETE METABOLIC PANEL WITH GFR
AG Ratio: 1.7 (calc) (ref 1.0–2.5)
ALT: 9 U/L (ref 9–46)
AST: 22 U/L (ref 10–35)
Albumin: 3.7 g/dL (ref 3.6–5.1)
Alkaline phosphatase (APISO): 78 U/L (ref 40–115)
BILIRUBIN TOTAL: 2.3 mg/dL — AB (ref 0.2–1.2)
BUN / CREAT RATIO: 37 (calc) — AB (ref 6–22)
BUN: 26 mg/dL — ABNORMAL HIGH (ref 7–25)
CHLORIDE: 98 mmol/L (ref 98–110)
CO2: 34 mmol/L — AB (ref 20–32)
Calcium: 8.5 mg/dL — ABNORMAL LOW (ref 8.6–10.3)
Creat: 0.7 mg/dL (ref 0.70–1.33)
GFR, Est African American: 126 mL/min/{1.73_m2} (ref >=60)
GFR, Est Non African American: 109 mL/min/{1.73_m2} (ref >=60)
GLOBULIN: 2.2 g/dL (ref 1.9–3.7)
Glucose, Bld: 313 mg/dL — ABNORMAL HIGH (ref 65–99)
POTASSIUM: 4.1 mmol/L (ref 3.5–5.3)
SODIUM: 139 mmol/L (ref 135–146)
Total Protein: 5.9 g/dL — ABNORMAL LOW (ref 6.1–8.1)

## 2018-03-02 ENCOUNTER — Other Ambulatory Visit: Payer: Self-pay | Admitting: Osteopathic Medicine

## 2018-03-02 DIAGNOSIS — E1151 Type 2 diabetes mellitus with diabetic peripheral angiopathy without gangrene: Secondary | ICD-10-CM | POA: Diagnosis not present

## 2018-03-02 DIAGNOSIS — R17 Unspecified jaundice: Secondary | ICD-10-CM

## 2018-03-02 DIAGNOSIS — I872 Venous insufficiency (chronic) (peripheral): Secondary | ICD-10-CM | POA: Diagnosis not present

## 2018-03-02 DIAGNOSIS — L97511 Non-pressure chronic ulcer of other part of right foot limited to breakdown of skin: Secondary | ICD-10-CM | POA: Diagnosis not present

## 2018-03-02 DIAGNOSIS — L97811 Non-pressure chronic ulcer of other part of right lower leg limited to breakdown of skin: Secondary | ICD-10-CM | POA: Diagnosis not present

## 2018-03-02 DIAGNOSIS — L97821 Non-pressure chronic ulcer of other part of left lower leg limited to breakdown of skin: Secondary | ICD-10-CM | POA: Diagnosis not present

## 2018-03-02 DIAGNOSIS — L97521 Non-pressure chronic ulcer of other part of left foot limited to breakdown of skin: Secondary | ICD-10-CM | POA: Diagnosis not present

## 2018-03-03 ENCOUNTER — Ambulatory Visit (HOSPITAL_BASED_OUTPATIENT_CLINIC_OR_DEPARTMENT_OTHER)
Admission: RE | Admit: 2018-03-03 | Discharge: 2018-03-03 | Disposition: A | Discharge status: Home / Self Care | Payer: Medicare Other | Source: Ambulatory Visit | Attending: Osteopathic Medicine | Admitting: Osteopathic Medicine

## 2018-03-03 DIAGNOSIS — R161 Splenomegaly, not elsewhere classified: Secondary | ICD-10-CM | POA: Insufficient documentation

## 2018-03-03 DIAGNOSIS — N281 Cyst of kidney, acquired: Secondary | ICD-10-CM | POA: Diagnosis not present

## 2018-03-03 DIAGNOSIS — Z6841 Body Mass Index (BMI) 40.0 and over, adult: Secondary | ICD-10-CM | POA: Diagnosis not present

## 2018-03-03 DIAGNOSIS — N2889 Other specified disorders of kidney and ureter: Secondary | ICD-10-CM | POA: Diagnosis not present

## 2018-03-03 MED ORDER — GADOBENATE DIMEGLUMINE 529 MG/ML IV SOLN
20.0000 mL | Freq: Once | INTRAVENOUS | Status: AC | PRN
  Administered 2018-03-03: 20 mL via INTRAVENOUS

## 2018-03-05 ENCOUNTER — Encounter: Payer: Self-pay | Admitting: Osteopathic Medicine

## 2018-03-05 DIAGNOSIS — R161 Splenomegaly, not elsewhere classified: Secondary | ICD-10-CM | POA: Insufficient documentation

## 2018-03-07 DIAGNOSIS — R579 Shock, unspecified: Secondary | ICD-10-CM | POA: Diagnosis not present

## 2018-03-07 DIAGNOSIS — G9341 Metabolic encephalopathy: Secondary | ICD-10-CM | POA: Diagnosis not present

## 2018-03-07 DIAGNOSIS — E790 Hyperuricemia without signs of inflammatory arthritis and tophaceous disease: Secondary | ICD-10-CM | POA: Diagnosis not present

## 2018-03-07 DIAGNOSIS — G4733 Obstructive sleep apnea (adult) (pediatric): Secondary | ICD-10-CM | POA: Diagnosis not present

## 2018-03-07 DIAGNOSIS — G893 Neoplasm related pain (acute) (chronic): Secondary | ICD-10-CM | POA: Diagnosis not present

## 2018-03-07 DIAGNOSIS — Z66 Do not resuscitate: Secondary | ICD-10-CM | POA: Diagnosis not present

## 2018-03-07 DIAGNOSIS — D6851 Activated protein C resistance: Secondary | ICD-10-CM | POA: Diagnosis not present

## 2018-03-07 DIAGNOSIS — R609 Edema, unspecified: Secondary | ICD-10-CM | POA: Diagnosis not present

## 2018-03-07 DIAGNOSIS — I351 Nonrheumatic aortic (valve) insufficiency: Secondary | ICD-10-CM | POA: Diagnosis not present

## 2018-03-07 DIAGNOSIS — I517 Cardiomegaly: Secondary | ICD-10-CM | POA: Diagnosis not present

## 2018-03-07 DIAGNOSIS — Z5181 Encounter for therapeutic drug level monitoring: Secondary | ICD-10-CM | POA: Diagnosis not present

## 2018-03-07 DIAGNOSIS — E872 Acidosis: Secondary | ICD-10-CM | POA: Diagnosis present

## 2018-03-07 DIAGNOSIS — E1140 Type 2 diabetes mellitus with diabetic neuropathy, unspecified: Secondary | ICD-10-CM | POA: Diagnosis not present

## 2018-03-07 DIAGNOSIS — I429 Cardiomyopathy, unspecified: Secondary | ICD-10-CM | POA: Diagnosis not present

## 2018-03-07 DIAGNOSIS — F332 Major depressive disorder, recurrent severe without psychotic features: Secondary | ICD-10-CM | POA: Diagnosis present

## 2018-03-07 DIAGNOSIS — I10 Essential (primary) hypertension: Secondary | ICD-10-CM | POA: Diagnosis not present

## 2018-03-07 DIAGNOSIS — R591 Generalized enlarged lymph nodes: Secondary | ICD-10-CM | POA: Diagnosis not present

## 2018-03-07 DIAGNOSIS — I5043 Acute on chronic combined systolic (congestive) and diastolic (congestive) heart failure: Secondary | ICD-10-CM | POA: Diagnosis not present

## 2018-03-07 DIAGNOSIS — F419 Anxiety disorder, unspecified: Secondary | ICD-10-CM | POA: Diagnosis not present

## 2018-03-07 DIAGNOSIS — R600 Localized edema: Secondary | ICD-10-CM | POA: Diagnosis not present

## 2018-03-07 DIAGNOSIS — N281 Cyst of kidney, acquired: Secondary | ICD-10-CM | POA: Diagnosis not present

## 2018-03-07 DIAGNOSIS — R531 Weakness: Secondary | ICD-10-CM | POA: Diagnosis not present

## 2018-03-07 DIAGNOSIS — A419 Sepsis, unspecified organism: Secondary | ICD-10-CM | POA: Diagnosis not present

## 2018-03-07 DIAGNOSIS — R5381 Other malaise: Secondary | ICD-10-CM | POA: Diagnosis not present

## 2018-03-07 DIAGNOSIS — I255 Ischemic cardiomyopathy: Secondary | ICD-10-CM | POA: Diagnosis present

## 2018-03-07 DIAGNOSIS — R110 Nausea: Secondary | ICD-10-CM | POA: Diagnosis not present

## 2018-03-07 DIAGNOSIS — I484 Atypical atrial flutter: Secondary | ICD-10-CM | POA: Diagnosis not present

## 2018-03-07 DIAGNOSIS — E873 Alkalosis: Secondary | ICD-10-CM | POA: Diagnosis present

## 2018-03-07 DIAGNOSIS — Z6841 Body Mass Index (BMI) 40.0 and over, adult: Secondary | ICD-10-CM | POA: Diagnosis not present

## 2018-03-07 DIAGNOSIS — F339 Major depressive disorder, recurrent, unspecified: Secondary | ICD-10-CM | POA: Diagnosis not present

## 2018-03-07 DIAGNOSIS — R570 Cardiogenic shock: Secondary | ICD-10-CM | POA: Diagnosis not present

## 2018-03-07 DIAGNOSIS — J811 Chronic pulmonary edema: Secondary | ICD-10-CM | POA: Diagnosis not present

## 2018-03-07 DIAGNOSIS — N179 Acute kidney failure, unspecified: Secondary | ICD-10-CM | POA: Diagnosis not present

## 2018-03-07 DIAGNOSIS — J90 Pleural effusion, not elsewhere classified: Secondary | ICD-10-CM | POA: Diagnosis not present

## 2018-03-07 DIAGNOSIS — I110 Hypertensive heart disease with heart failure: Secondary | ICD-10-CM | POA: Diagnosis not present

## 2018-03-07 DIAGNOSIS — J9611 Chronic respiratory failure with hypoxia: Secondary | ICD-10-CM | POA: Diagnosis not present

## 2018-03-07 DIAGNOSIS — I420 Dilated cardiomyopathy: Secondary | ICD-10-CM | POA: Diagnosis present

## 2018-03-07 DIAGNOSIS — G8921 Chronic pain due to trauma: Secondary | ICD-10-CM | POA: Diagnosis not present

## 2018-03-07 DIAGNOSIS — R2681 Unsteadiness on feet: Secondary | ICD-10-CM | POA: Diagnosis not present

## 2018-03-07 DIAGNOSIS — R0989 Other specified symptoms and signs involving the circulatory and respiratory systems: Secondary | ICD-10-CM | POA: Diagnosis not present

## 2018-03-07 DIAGNOSIS — R918 Other nonspecific abnormal finding of lung field: Secondary | ICD-10-CM | POA: Diagnosis not present

## 2018-03-07 DIAGNOSIS — J9811 Atelectasis: Secondary | ICD-10-CM | POA: Diagnosis not present

## 2018-03-07 DIAGNOSIS — I959 Hypotension, unspecified: Secondary | ICD-10-CM | POA: Diagnosis not present

## 2018-03-07 DIAGNOSIS — E162 Hypoglycemia, unspecified: Secondary | ICD-10-CM | POA: Diagnosis not present

## 2018-03-07 DIAGNOSIS — Z955 Presence of coronary angioplasty implant and graft: Secondary | ICD-10-CM | POA: Diagnosis not present

## 2018-03-07 DIAGNOSIS — R509 Fever, unspecified: Secondary | ICD-10-CM | POA: Diagnosis not present

## 2018-03-07 DIAGNOSIS — M79606 Pain in leg, unspecified: Secondary | ICD-10-CM | POA: Diagnosis not present

## 2018-03-07 DIAGNOSIS — Y92009 Unspecified place in unspecified non-institutional (private) residence as the place of occurrence of the external cause: Secondary | ICD-10-CM | POA: Diagnosis not present

## 2018-03-07 DIAGNOSIS — W19XXXA Unspecified fall, initial encounter: Secondary | ICD-10-CM | POA: Diagnosis not present

## 2018-03-07 DIAGNOSIS — R112 Nausea with vomiting, unspecified: Secondary | ICD-10-CM | POA: Diagnosis not present

## 2018-03-07 DIAGNOSIS — D599 Acquired hemolytic anemia, unspecified: Secondary | ICD-10-CM | POA: Diagnosis not present

## 2018-03-07 DIAGNOSIS — I471 Supraventricular tachycardia: Secondary | ICD-10-CM | POA: Diagnosis not present

## 2018-03-07 DIAGNOSIS — R1111 Vomiting without nausea: Secondary | ICD-10-CM | POA: Diagnosis not present

## 2018-03-07 DIAGNOSIS — Z452 Encounter for adjustment and management of vascular access device: Secondary | ICD-10-CM | POA: Diagnosis not present

## 2018-03-07 DIAGNOSIS — M79605 Pain in left leg: Secondary | ICD-10-CM | POA: Diagnosis not present

## 2018-03-07 DIAGNOSIS — Z515 Encounter for palliative care: Secondary | ICD-10-CM | POA: Diagnosis not present

## 2018-03-07 DIAGNOSIS — Z0000 Encounter for general adult medical examination without abnormal findings: Secondary | ICD-10-CM | POA: Diagnosis not present

## 2018-03-07 DIAGNOSIS — E039 Hypothyroidism, unspecified: Secondary | ICD-10-CM | POA: Diagnosis not present

## 2018-03-07 DIAGNOSIS — I739 Peripheral vascular disease, unspecified: Secondary | ICD-10-CM | POA: Diagnosis not present

## 2018-03-07 DIAGNOSIS — M79604 Pain in right leg: Secondary | ICD-10-CM | POA: Diagnosis not present

## 2018-03-07 DIAGNOSIS — I890 Lymphedema, not elsewhere classified: Secondary | ICD-10-CM | POA: Diagnosis not present

## 2018-03-07 DIAGNOSIS — D61818 Other pancytopenia: Secondary | ICD-10-CM | POA: Diagnosis not present

## 2018-03-07 DIAGNOSIS — R404 Transient alteration of awareness: Secondary | ICD-10-CM | POA: Diagnosis not present

## 2018-03-07 DIAGNOSIS — R578 Other shock: Secondary | ICD-10-CM | POA: Diagnosis not present

## 2018-03-07 DIAGNOSIS — W19XXXD Unspecified fall, subsequent encounter: Secondary | ICD-10-CM | POA: Diagnosis not present

## 2018-03-07 DIAGNOSIS — R160 Hepatomegaly, not elsewhere classified: Secondary | ICD-10-CM | POA: Diagnosis not present

## 2018-03-07 DIAGNOSIS — R54 Age-related physical debility: Secondary | ICD-10-CM | POA: Diagnosis not present

## 2018-03-07 DIAGNOSIS — I872 Venous insufficiency (chronic) (peripheral): Secondary | ICD-10-CM | POA: Diagnosis not present

## 2018-03-07 DIAGNOSIS — K219 Gastro-esophageal reflux disease without esophagitis: Secondary | ICD-10-CM | POA: Diagnosis not present

## 2018-03-07 DIAGNOSIS — I5040 Unspecified combined systolic (congestive) and diastolic (congestive) heart failure: Secondary | ICD-10-CM | POA: Diagnosis not present

## 2018-03-07 DIAGNOSIS — F331 Major depressive disorder, recurrent, moderate: Secondary | ICD-10-CM | POA: Diagnosis not present

## 2018-03-07 DIAGNOSIS — Z7901 Long term (current) use of anticoagulants: Secondary | ICD-10-CM | POA: Diagnosis not present

## 2018-03-07 DIAGNOSIS — M48061 Spinal stenosis, lumbar region without neurogenic claudication: Secondary | ICD-10-CM | POA: Diagnosis not present

## 2018-03-07 DIAGNOSIS — R0602 Shortness of breath: Secondary | ICD-10-CM | POA: Diagnosis not present

## 2018-03-07 DIAGNOSIS — R6521 Severe sepsis with septic shock: Secondary | ICD-10-CM | POA: Diagnosis not present

## 2018-03-07 DIAGNOSIS — S0990XA Unspecified injury of head, initial encounter: Secondary | ICD-10-CM | POA: Diagnosis not present

## 2018-03-07 DIAGNOSIS — C9500 Acute leukemia of unspecified cell type not having achieved remission: Secondary | ICD-10-CM | POA: Diagnosis not present

## 2018-03-07 DIAGNOSIS — J9601 Acute respiratory failure with hypoxia: Secondary | ICD-10-CM | POA: Diagnosis not present

## 2018-03-07 DIAGNOSIS — M6281 Muscle weakness (generalized): Secondary | ICD-10-CM | POA: Diagnosis not present

## 2018-03-07 DIAGNOSIS — C9200 Acute myeloblastic leukemia, not having achieved remission: Secondary | ICD-10-CM | POA: Diagnosis not present

## 2018-03-07 DIAGNOSIS — I509 Heart failure, unspecified: Secondary | ICD-10-CM | POA: Diagnosis not present

## 2018-03-07 DIAGNOSIS — I5023 Acute on chronic systolic (congestive) heart failure: Secondary | ICD-10-CM | POA: Diagnosis not present

## 2018-03-07 DIAGNOSIS — L97921 Non-pressure chronic ulcer of unspecified part of left lower leg limited to breakdown of skin: Secondary | ICD-10-CM | POA: Diagnosis not present

## 2018-03-07 DIAGNOSIS — G894 Chronic pain syndrome: Secondary | ICD-10-CM | POA: Diagnosis not present

## 2018-03-07 DIAGNOSIS — R410 Disorientation, unspecified: Secondary | ICD-10-CM | POA: Diagnosis not present

## 2018-03-07 DIAGNOSIS — F05 Delirium due to known physiological condition: Secondary | ICD-10-CM | POA: Diagnosis not present

## 2018-03-07 DIAGNOSIS — F411 Generalized anxiety disorder: Secondary | ICD-10-CM | POA: Diagnosis not present

## 2018-03-07 DIAGNOSIS — I340 Nonrheumatic mitral (valve) insufficiency: Secondary | ICD-10-CM | POA: Diagnosis present

## 2018-03-07 DIAGNOSIS — I5189 Other ill-defined heart diseases: Secondary | ICD-10-CM | POA: Diagnosis not present

## 2018-03-07 DIAGNOSIS — Z743 Need for continuous supervision: Secondary | ICD-10-CM | POA: Diagnosis not present

## 2018-03-07 DIAGNOSIS — R2689 Other abnormalities of gait and mobility: Secondary | ICD-10-CM | POA: Diagnosis not present

## 2018-03-07 DIAGNOSIS — E871 Hypo-osmolality and hyponatremia: Secondary | ICD-10-CM | POA: Diagnosis present

## 2018-03-07 DIAGNOSIS — I2510 Atherosclerotic heart disease of native coronary artery without angina pectoris: Secondary | ICD-10-CM | POA: Diagnosis not present

## 2018-03-07 DIAGNOSIS — R339 Retention of urine, unspecified: Secondary | ICD-10-CM | POA: Diagnosis not present

## 2018-03-07 DIAGNOSIS — N289 Disorder of kidney and ureter, unspecified: Secondary | ICD-10-CM | POA: Diagnosis not present

## 2018-03-07 DIAGNOSIS — Z794 Long term (current) use of insulin: Secondary | ICD-10-CM | POA: Diagnosis not present

## 2018-03-07 DIAGNOSIS — Z86711 Personal history of pulmonary embolism: Secondary | ICD-10-CM | POA: Diagnosis not present

## 2018-03-20 ENCOUNTER — Ambulatory Visit: Payer: Medicare Other | Admitting: Osteopathic Medicine

## 2018-03-25 DIAGNOSIS — A419 Sepsis, unspecified organism: Secondary | ICD-10-CM | POA: Diagnosis not present

## 2018-03-25 DIAGNOSIS — D649 Anemia, unspecified: Secondary | ICD-10-CM | POA: Diagnosis not present

## 2018-03-25 DIAGNOSIS — I484 Atypical atrial flutter: Secondary | ICD-10-CM | POA: Diagnosis not present

## 2018-03-25 DIAGNOSIS — J984 Other disorders of lung: Secondary | ICD-10-CM | POA: Diagnosis not present

## 2018-03-25 DIAGNOSIS — E785 Hyperlipidemia, unspecified: Secondary | ICD-10-CM | POA: Diagnosis present

## 2018-03-25 DIAGNOSIS — M48061 Spinal stenosis, lumbar region without neurogenic claudication: Secondary | ICD-10-CM | POA: Diagnosis not present

## 2018-03-25 DIAGNOSIS — J9600 Acute respiratory failure, unspecified whether with hypoxia or hypercapnia: Secondary | ICD-10-CM | POA: Diagnosis not present

## 2018-03-25 DIAGNOSIS — J9622 Acute and chronic respiratory failure with hypercapnia: Secondary | ICD-10-CM | POA: Diagnosis present

## 2018-03-25 DIAGNOSIS — F419 Anxiety disorder, unspecified: Secondary | ICD-10-CM | POA: Diagnosis present

## 2018-03-25 DIAGNOSIS — I255 Ischemic cardiomyopathy: Secondary | ICD-10-CM | POA: Diagnosis not present

## 2018-03-25 DIAGNOSIS — Z809 Family history of malignant neoplasm, unspecified: Secondary | ICD-10-CM | POA: Diagnosis not present

## 2018-03-25 DIAGNOSIS — E162 Hypoglycemia, unspecified: Secondary | ICD-10-CM | POA: Diagnosis not present

## 2018-03-25 DIAGNOSIS — T8089XA Other complications following infusion, transfusion and therapeutic injection, initial encounter: Secondary | ICD-10-CM | POA: Diagnosis not present

## 2018-03-25 DIAGNOSIS — Z86718 Personal history of other venous thrombosis and embolism: Secondary | ICD-10-CM | POA: Diagnosis not present

## 2018-03-25 DIAGNOSIS — E119 Type 2 diabetes mellitus without complications: Secondary | ICD-10-CM | POA: Diagnosis not present

## 2018-03-25 DIAGNOSIS — D599 Acquired hemolytic anemia, unspecified: Secondary | ICD-10-CM | POA: Diagnosis not present

## 2018-03-25 DIAGNOSIS — R7889 Finding of other specified substances, not normally found in blood: Secondary | ICD-10-CM | POA: Diagnosis not present

## 2018-03-25 DIAGNOSIS — R2689 Other abnormalities of gait and mobility: Secondary | ICD-10-CM | POA: Diagnosis not present

## 2018-03-25 DIAGNOSIS — Z856 Personal history of leukemia: Secondary | ICD-10-CM | POA: Diagnosis not present

## 2018-03-25 DIAGNOSIS — R54 Age-related physical debility: Secondary | ICD-10-CM | POA: Diagnosis not present

## 2018-03-25 DIAGNOSIS — J9621 Acute and chronic respiratory failure with hypoxia: Secondary | ICD-10-CM | POA: Diagnosis present

## 2018-03-25 DIAGNOSIS — Z515 Encounter for palliative care: Secondary | ICD-10-CM | POA: Diagnosis present

## 2018-03-25 DIAGNOSIS — R918 Other nonspecific abnormal finding of lung field: Secondary | ICD-10-CM | POA: Diagnosis not present

## 2018-03-25 DIAGNOSIS — R531 Weakness: Secondary | ICD-10-CM | POA: Diagnosis present

## 2018-03-25 DIAGNOSIS — I517 Cardiomegaly: Secondary | ICD-10-CM | POA: Diagnosis not present

## 2018-03-25 DIAGNOSIS — C9500 Acute leukemia of unspecified cell type not having achieved remission: Secondary | ICD-10-CM | POA: Diagnosis not present

## 2018-03-25 DIAGNOSIS — L97921 Non-pressure chronic ulcer of unspecified part of left lower leg limited to breakdown of skin: Secondary | ICD-10-CM | POA: Diagnosis not present

## 2018-03-25 DIAGNOSIS — E039 Hypothyroidism, unspecified: Secondary | ICD-10-CM | POA: Diagnosis present

## 2018-03-25 DIAGNOSIS — I420 Dilated cardiomyopathy: Secondary | ICD-10-CM | POA: Diagnosis present

## 2018-03-25 DIAGNOSIS — Z794 Long term (current) use of insulin: Secondary | ICD-10-CM | POA: Diagnosis not present

## 2018-03-25 DIAGNOSIS — I5043 Acute on chronic combined systolic (congestive) and diastolic (congestive) heart failure: Secondary | ICD-10-CM | POA: Diagnosis not present

## 2018-03-25 DIAGNOSIS — Z862 Personal history of diseases of the blood and blood-forming organs and certain disorders involving the immune mechanism: Secondary | ICD-10-CM | POA: Diagnosis not present

## 2018-03-25 DIAGNOSIS — F329 Major depressive disorder, single episode, unspecified: Secondary | ICD-10-CM | POA: Diagnosis present

## 2018-03-25 DIAGNOSIS — D72829 Elevated white blood cell count, unspecified: Secondary | ICD-10-CM | POA: Diagnosis not present

## 2018-03-25 DIAGNOSIS — C9200 Acute myeloblastic leukemia, not having achieved remission: Secondary | ICD-10-CM | POA: Diagnosis not present

## 2018-03-25 DIAGNOSIS — I872 Venous insufficiency (chronic) (peripheral): Secondary | ICD-10-CM | POA: Diagnosis not present

## 2018-03-25 DIAGNOSIS — G894 Chronic pain syndrome: Secondary | ICD-10-CM | POA: Diagnosis not present

## 2018-03-25 DIAGNOSIS — M79606 Pain in leg, unspecified: Secondary | ICD-10-CM | POA: Diagnosis not present

## 2018-03-25 DIAGNOSIS — R579 Shock, unspecified: Secondary | ICD-10-CM | POA: Diagnosis not present

## 2018-03-25 DIAGNOSIS — R2681 Unsteadiness on feet: Secondary | ICD-10-CM | POA: Diagnosis not present

## 2018-03-25 DIAGNOSIS — N179 Acute kidney failure, unspecified: Secondary | ICD-10-CM | POA: Diagnosis not present

## 2018-03-25 DIAGNOSIS — I5040 Unspecified combined systolic (congestive) and diastolic (congestive) heart failure: Secondary | ICD-10-CM | POA: Diagnosis not present

## 2018-03-25 DIAGNOSIS — G8921 Chronic pain due to trauma: Secondary | ICD-10-CM | POA: Diagnosis not present

## 2018-03-25 DIAGNOSIS — I110 Hypertensive heart disease with heart failure: Secondary | ICD-10-CM | POA: Diagnosis not present

## 2018-03-25 DIAGNOSIS — D696 Thrombocytopenia, unspecified: Secondary | ICD-10-CM | POA: Diagnosis not present

## 2018-03-25 DIAGNOSIS — I890 Lymphedema, not elsewhere classified: Secondary | ICD-10-CM | POA: Diagnosis not present

## 2018-03-25 DIAGNOSIS — R9431 Abnormal electrocardiogram [ECG] [EKG]: Secondary | ICD-10-CM | POA: Diagnosis not present

## 2018-03-25 DIAGNOSIS — I2510 Atherosclerotic heart disease of native coronary artery without angina pectoris: Secondary | ICD-10-CM | POA: Diagnosis present

## 2018-03-25 DIAGNOSIS — R061 Stridor: Secondary | ICD-10-CM | POA: Diagnosis not present

## 2018-03-25 DIAGNOSIS — Z5181 Encounter for therapeutic drug level monitoring: Secondary | ICD-10-CM | POA: Diagnosis not present

## 2018-03-25 DIAGNOSIS — K219 Gastro-esophageal reflux disease without esophagitis: Secondary | ICD-10-CM | POA: Diagnosis present

## 2018-03-25 DIAGNOSIS — Z86711 Personal history of pulmonary embolism: Secondary | ICD-10-CM | POA: Diagnosis not present

## 2018-03-25 DIAGNOSIS — E669 Obesity, unspecified: Secondary | ICD-10-CM | POA: Diagnosis present

## 2018-03-25 DIAGNOSIS — I509 Heart failure, unspecified: Secondary | ICD-10-CM | POA: Diagnosis not present

## 2018-03-25 DIAGNOSIS — Z8614 Personal history of Methicillin resistant Staphylococcus aureus infection: Secondary | ICD-10-CM | POA: Diagnosis not present

## 2018-03-25 DIAGNOSIS — J9610 Chronic respiratory failure, unspecified whether with hypoxia or hypercapnia: Secondary | ICD-10-CM | POA: Diagnosis not present

## 2018-03-25 DIAGNOSIS — C9590 Leukemia, unspecified not having achieved remission: Secondary | ICD-10-CM | POA: Diagnosis not present

## 2018-03-25 DIAGNOSIS — Z6841 Body Mass Index (BMI) 40.0 and over, adult: Secondary | ICD-10-CM | POA: Diagnosis not present

## 2018-03-25 DIAGNOSIS — G4730 Sleep apnea, unspecified: Secondary | ICD-10-CM | POA: Diagnosis not present

## 2018-03-25 DIAGNOSIS — Z79899 Other long term (current) drug therapy: Secondary | ICD-10-CM | POA: Diagnosis not present

## 2018-03-25 DIAGNOSIS — I429 Cardiomyopathy, unspecified: Secondary | ICD-10-CM | POA: Diagnosis not present

## 2018-03-25 DIAGNOSIS — Z7901 Long term (current) use of anticoagulants: Secondary | ICD-10-CM | POA: Diagnosis not present

## 2018-03-25 DIAGNOSIS — J9690 Respiratory failure, unspecified, unspecified whether with hypoxia or hypercapnia: Secondary | ICD-10-CM | POA: Diagnosis not present

## 2018-03-25 DIAGNOSIS — R609 Edema, unspecified: Secondary | ICD-10-CM | POA: Diagnosis not present

## 2018-03-25 DIAGNOSIS — R233 Spontaneous ecchymoses: Secondary | ICD-10-CM | POA: Diagnosis not present

## 2018-03-25 DIAGNOSIS — M6281 Muscle weakness (generalized): Secondary | ICD-10-CM | POA: Diagnosis not present

## 2018-03-25 DIAGNOSIS — R0602 Shortness of breath: Secondary | ICD-10-CM | POA: Diagnosis not present

## 2018-03-25 DIAGNOSIS — G9341 Metabolic encephalopathy: Secondary | ICD-10-CM | POA: Diagnosis present

## 2018-03-25 DIAGNOSIS — R6521 Severe sepsis with septic shock: Secondary | ICD-10-CM | POA: Diagnosis not present

## 2018-03-25 DIAGNOSIS — F339 Major depressive disorder, recurrent, unspecified: Secondary | ICD-10-CM | POA: Diagnosis not present

## 2018-03-25 DIAGNOSIS — G4733 Obstructive sleep apnea (adult) (pediatric): Secondary | ICD-10-CM | POA: Diagnosis not present

## 2018-03-25 DIAGNOSIS — F411 Generalized anxiety disorder: Secondary | ICD-10-CM | POA: Diagnosis not present

## 2018-03-25 DIAGNOSIS — R339 Retention of urine, unspecified: Secondary | ICD-10-CM | POA: Diagnosis not present

## 2018-03-25 DIAGNOSIS — D6851 Activated protein C resistance: Secondary | ICD-10-CM | POA: Diagnosis not present

## 2018-03-25 DIAGNOSIS — D61818 Other pancytopenia: Secondary | ICD-10-CM | POA: Diagnosis not present

## 2018-03-25 DIAGNOSIS — Z743 Need for continuous supervision: Secondary | ICD-10-CM | POA: Diagnosis not present

## 2018-03-25 DIAGNOSIS — R0902 Hypoxemia: Secondary | ICD-10-CM | POA: Diagnosis not present

## 2018-03-25 DIAGNOSIS — I739 Peripheral vascular disease, unspecified: Secondary | ICD-10-CM | POA: Diagnosis not present

## 2018-03-25 DIAGNOSIS — I10 Essential (primary) hypertension: Secondary | ICD-10-CM | POA: Diagnosis not present

## 2018-03-25 DIAGNOSIS — R600 Localized edema: Secondary | ICD-10-CM | POA: Diagnosis not present

## 2018-03-25 DIAGNOSIS — E1140 Type 2 diabetes mellitus with diabetic neuropathy, unspecified: Secondary | ICD-10-CM | POA: Diagnosis not present

## 2018-03-26 DIAGNOSIS — D6851 Activated protein C resistance: Secondary | ICD-10-CM | POA: Diagnosis not present

## 2018-03-26 DIAGNOSIS — A419 Sepsis, unspecified organism: Secondary | ICD-10-CM | POA: Diagnosis not present

## 2018-03-26 DIAGNOSIS — D61818 Other pancytopenia: Secondary | ICD-10-CM | POA: Diagnosis not present

## 2018-03-26 DIAGNOSIS — C9200 Acute myeloblastic leukemia, not having achieved remission: Secondary | ICD-10-CM | POA: Diagnosis not present

## 2018-03-27 ENCOUNTER — Ambulatory Visit: Payer: Medicare Other | Admitting: Osteopathic Medicine

## 2018-03-27 DIAGNOSIS — Z0189 Encounter for other specified special examinations: Secondary | ICD-10-CM

## 2018-03-27 DIAGNOSIS — D6851 Activated protein C resistance: Secondary | ICD-10-CM | POA: Diagnosis not present

## 2018-03-27 DIAGNOSIS — A419 Sepsis, unspecified organism: Secondary | ICD-10-CM | POA: Diagnosis not present

## 2018-03-27 DIAGNOSIS — C9200 Acute myeloblastic leukemia, not having achieved remission: Secondary | ICD-10-CM | POA: Diagnosis not present

## 2018-03-27 DIAGNOSIS — D61818 Other pancytopenia: Secondary | ICD-10-CM | POA: Diagnosis not present

## 2018-03-27 MED ORDER — SODIUM CHLORIDE 0.9 % IV SOLN
INTRAVENOUS | Status: DC
Start: ? — End: 2018-03-27

## 2018-03-27 MED ORDER — LACTULOSE 10 GM/15ML PO SOLN
20.00 | ORAL | Status: DC
Start: ? — End: 2018-03-27

## 2018-03-27 MED ORDER — DULOXETINE HCL 60 MG PO CPEP
60.00 | ORAL_CAPSULE | ORAL | Status: DC
Start: 2018-03-25 — End: 2018-03-27

## 2018-03-27 MED ORDER — INSULIN GLARGINE 100 UNIT/ML ~~LOC~~ SOLN
SUBCUTANEOUS | Status: DC
Start: 2018-03-25 — End: 2018-03-27

## 2018-03-27 MED ORDER — ACETAMINOPHEN 325 MG PO TABS
650.00 | ORAL_TABLET | ORAL | Status: DC
Start: ? — End: 2018-03-27

## 2018-03-27 MED ORDER — INSULIN LISPRO 100 UNIT/ML ~~LOC~~ SOLN
SUBCUTANEOUS | Status: DC
Start: ? — End: 2018-03-27

## 2018-03-27 MED ORDER — MORPHINE SULFATE 15 MG PO TABS
15.00 | ORAL_TABLET | ORAL | Status: DC
Start: ? — End: 2018-03-27

## 2018-03-27 MED ORDER — LEVOTHYROXINE SODIUM 112 MCG PO TABS
ORAL_TABLET | ORAL | Status: DC
Start: 2018-03-26 — End: 2018-03-27

## 2018-03-27 MED ORDER — ONDANSETRON HCL 4 MG/2ML IJ SOLN
4.00 | INTRAMUSCULAR | Status: DC
Start: ? — End: 2018-03-27

## 2018-03-27 MED ORDER — GENERIC EXTERNAL MEDICATION
75.00 | Status: DC
Start: 2018-03-25 — End: 2018-03-27

## 2018-03-27 MED ORDER — ALLOPURINOL 100 MG PO TABS
300.00 | ORAL_TABLET | ORAL | Status: DC
Start: 2018-03-26 — End: 2018-03-27

## 2018-03-27 MED ORDER — AMIODARONE HCL 200 MG PO TABS
200.00 | ORAL_TABLET | ORAL | Status: DC
Start: 2018-03-26 — End: 2018-03-27

## 2018-03-27 MED ORDER — DIGOXIN 125 MCG PO TABS
0.13 | ORAL_TABLET | ORAL | Status: DC
Start: 2018-03-26 — End: 2018-03-27

## 2018-03-27 MED ORDER — SENNA-DOCUSATE SODIUM 8.6-50 MG PO TABS
ORAL_TABLET | ORAL | Status: DC
Start: 2018-03-25 — End: 2018-03-27

## 2018-03-27 MED ORDER — POLYETHYLENE GLYCOL 3350 17 G PO PACK
17.00 | PACK | ORAL | Status: DC
Start: 2018-03-26 — End: 2018-03-27

## 2018-03-27 MED ORDER — BUPROPION HCL 75 MG PO TABS
75.00 | ORAL_TABLET | ORAL | Status: DC
Start: 2018-03-25 — End: 2018-03-27

## 2018-03-27 MED ORDER — LORAZEPAM 0.5 MG PO TABS
0.50 | ORAL_TABLET | ORAL | Status: DC
Start: ? — End: 2018-03-27

## 2018-03-27 MED ORDER — GENERIC EXTERNAL MEDICATION
Status: DC
Start: ? — End: 2018-03-27

## 2018-03-27 MED ORDER — MORPHINE SULFATE 15 MG PO TABS
30.00 | ORAL_TABLET | ORAL | Status: DC
Start: ? — End: 2018-03-27

## 2018-03-27 MED ORDER — INSULIN LISPRO 100 UNIT/ML ~~LOC~~ SOLN
SUBCUTANEOUS | Status: DC
Start: 2018-03-25 — End: 2018-03-27

## 2018-03-27 MED ORDER — FUROSEMIDE 10 MG/ML IJ SOLN
40.00 | INTRAMUSCULAR | Status: DC
Start: 2018-03-26 — End: 2018-03-27

## 2018-03-27 MED ORDER — FAMOTIDINE 20 MG PO TABS
20.00 | ORAL_TABLET | ORAL | Status: DC
Start: 2018-03-25 — End: 2018-03-27

## 2018-03-27 MED ORDER — CHLORHEXIDINE GLUCONATE 0.12 % MT SOLN
15.00 | OROMUCOSAL | Status: DC
Start: 2018-03-25 — End: 2018-03-27

## 2018-03-27 MED ORDER — CLOTRIMAZOLE 1 % EX CREA
TOPICAL_CREAM | CUTANEOUS | Status: DC
Start: 2018-03-25 — End: 2018-03-27

## 2018-03-28 DIAGNOSIS — J9610 Chronic respiratory failure, unspecified whether with hypoxia or hypercapnia: Secondary | ICD-10-CM | POA: Diagnosis not present

## 2018-03-28 DIAGNOSIS — G4730 Sleep apnea, unspecified: Secondary | ICD-10-CM | POA: Diagnosis not present

## 2018-03-28 DIAGNOSIS — R9431 Abnormal electrocardiogram [ECG] [EKG]: Secondary | ICD-10-CM | POA: Diagnosis not present

## 2018-03-28 DIAGNOSIS — E119 Type 2 diabetes mellitus without complications: Secondary | ICD-10-CM | POA: Diagnosis not present

## 2018-03-28 DIAGNOSIS — C9590 Leukemia, unspecified not having achieved remission: Secondary | ICD-10-CM | POA: Diagnosis not present

## 2018-03-28 DIAGNOSIS — I110 Hypertensive heart disease with heart failure: Secondary | ICD-10-CM | POA: Diagnosis not present

## 2018-03-28 DIAGNOSIS — I2510 Atherosclerotic heart disease of native coronary artery without angina pectoris: Secondary | ICD-10-CM | POA: Diagnosis not present

## 2018-03-28 DIAGNOSIS — I509 Heart failure, unspecified: Secondary | ICD-10-CM | POA: Diagnosis not present

## 2018-03-29 MED ORDER — SODIUM CHLORIDE 0.9 % IV SOLN
INTRAVENOUS | Status: DC
Start: ? — End: 2018-03-29

## 2018-03-30 DIAGNOSIS — Z809 Family history of malignant neoplasm, unspecified: Secondary | ICD-10-CM | POA: Diagnosis not present

## 2018-03-30 DIAGNOSIS — I509 Heart failure, unspecified: Secondary | ICD-10-CM | POA: Diagnosis not present

## 2018-03-30 DIAGNOSIS — Z5181 Encounter for therapeutic drug level monitoring: Secondary | ICD-10-CM | POA: Diagnosis not present

## 2018-03-30 DIAGNOSIS — R0902 Hypoxemia: Secondary | ICD-10-CM | POA: Diagnosis not present

## 2018-03-30 DIAGNOSIS — D696 Thrombocytopenia, unspecified: Secondary | ICD-10-CM | POA: Diagnosis not present

## 2018-03-30 DIAGNOSIS — Z7901 Long term (current) use of anticoagulants: Secondary | ICD-10-CM | POA: Diagnosis not present

## 2018-03-30 DIAGNOSIS — Z86711 Personal history of pulmonary embolism: Secondary | ICD-10-CM | POA: Diagnosis not present

## 2018-03-30 DIAGNOSIS — C9200 Acute myeloblastic leukemia, not having achieved remission: Secondary | ICD-10-CM | POA: Diagnosis not present

## 2018-03-30 DIAGNOSIS — D72829 Elevated white blood cell count, unspecified: Secondary | ICD-10-CM | POA: Diagnosis not present

## 2018-03-30 DIAGNOSIS — D6851 Activated protein C resistance: Secondary | ICD-10-CM | POA: Diagnosis not present

## 2018-04-02 DIAGNOSIS — I517 Cardiomegaly: Secondary | ICD-10-CM | POA: Diagnosis not present

## 2018-04-02 DIAGNOSIS — G4730 Sleep apnea, unspecified: Secondary | ICD-10-CM | POA: Diagnosis not present

## 2018-04-02 DIAGNOSIS — D649 Anemia, unspecified: Secondary | ICD-10-CM | POA: Diagnosis not present

## 2018-04-02 DIAGNOSIS — J9610 Chronic respiratory failure, unspecified whether with hypoxia or hypercapnia: Secondary | ICD-10-CM | POA: Diagnosis not present

## 2018-04-02 DIAGNOSIS — R918 Other nonspecific abnormal finding of lung field: Secondary | ICD-10-CM | POA: Diagnosis not present

## 2018-04-02 DIAGNOSIS — J9622 Acute and chronic respiratory failure with hypercapnia: Secondary | ICD-10-CM | POA: Diagnosis not present

## 2018-04-02 DIAGNOSIS — I10 Essential (primary) hypertension: Secondary | ICD-10-CM | POA: Diagnosis not present

## 2018-04-02 DIAGNOSIS — I110 Hypertensive heart disease with heart failure: Secondary | ICD-10-CM | POA: Diagnosis not present

## 2018-04-02 DIAGNOSIS — N179 Acute kidney failure, unspecified: Secondary | ICD-10-CM | POA: Diagnosis not present

## 2018-04-02 DIAGNOSIS — Z856 Personal history of leukemia: Secondary | ICD-10-CM | POA: Diagnosis not present

## 2018-04-02 DIAGNOSIS — C9200 Acute myeloblastic leukemia, not having achieved remission: Secondary | ICD-10-CM | POA: Diagnosis not present

## 2018-04-02 DIAGNOSIS — C9500 Acute leukemia of unspecified cell type not having achieved remission: Secondary | ICD-10-CM | POA: Diagnosis not present

## 2018-04-02 DIAGNOSIS — J984 Other disorders of lung: Secondary | ICD-10-CM | POA: Diagnosis not present

## 2018-04-02 DIAGNOSIS — J9690 Respiratory failure, unspecified, unspecified whether with hypoxia or hypercapnia: Secondary | ICD-10-CM | POA: Diagnosis not present

## 2018-04-02 DIAGNOSIS — D61818 Other pancytopenia: Secondary | ICD-10-CM | POA: Diagnosis not present

## 2018-04-02 DIAGNOSIS — J9621 Acute and chronic respiratory failure with hypoxia: Secondary | ICD-10-CM | POA: Diagnosis not present

## 2018-04-02 DIAGNOSIS — R233 Spontaneous ecchymoses: Secondary | ICD-10-CM | POA: Diagnosis not present

## 2018-04-02 DIAGNOSIS — R0602 Shortness of breath: Secondary | ICD-10-CM | POA: Diagnosis not present

## 2018-04-02 DIAGNOSIS — G9341 Metabolic encephalopathy: Secondary | ICD-10-CM | POA: Diagnosis not present

## 2018-04-03 DIAGNOSIS — Z79899 Other long term (current) drug therapy: Secondary | ICD-10-CM | POA: Diagnosis not present

## 2018-04-03 DIAGNOSIS — I420 Dilated cardiomyopathy: Secondary | ICD-10-CM | POA: Diagnosis present

## 2018-04-03 DIAGNOSIS — F419 Anxiety disorder, unspecified: Secondary | ICD-10-CM | POA: Diagnosis present

## 2018-04-03 DIAGNOSIS — I110 Hypertensive heart disease with heart failure: Secondary | ICD-10-CM | POA: Diagnosis present

## 2018-04-03 DIAGNOSIS — N179 Acute kidney failure, unspecified: Secondary | ICD-10-CM | POA: Diagnosis not present

## 2018-04-03 DIAGNOSIS — R918 Other nonspecific abnormal finding of lung field: Secondary | ICD-10-CM | POA: Diagnosis not present

## 2018-04-03 DIAGNOSIS — C9200 Acute myeloblastic leukemia, not having achieved remission: Secondary | ICD-10-CM | POA: Diagnosis not present

## 2018-04-03 DIAGNOSIS — G894 Chronic pain syndrome: Secondary | ICD-10-CM | POA: Diagnosis present

## 2018-04-03 DIAGNOSIS — I5023 Acute on chronic systolic (congestive) heart failure: Secondary | ICD-10-CM | POA: Diagnosis not present

## 2018-04-03 DIAGNOSIS — Z86718 Personal history of other venous thrombosis and embolism: Secondary | ICD-10-CM | POA: Diagnosis not present

## 2018-04-03 DIAGNOSIS — G893 Neoplasm related pain (acute) (chronic): Secondary | ICD-10-CM | POA: Diagnosis not present

## 2018-04-03 DIAGNOSIS — Z515 Encounter for palliative care: Secondary | ICD-10-CM | POA: Diagnosis present

## 2018-04-03 DIAGNOSIS — G9341 Metabolic encephalopathy: Secondary | ICD-10-CM | POA: Diagnosis present

## 2018-04-03 DIAGNOSIS — I509 Heart failure, unspecified: Secondary | ICD-10-CM | POA: Diagnosis not present

## 2018-04-03 DIAGNOSIS — I10 Essential (primary) hypertension: Secondary | ICD-10-CM | POA: Diagnosis not present

## 2018-04-03 DIAGNOSIS — Z6841 Body Mass Index (BMI) 40.0 and over, adult: Secondary | ICD-10-CM | POA: Diagnosis not present

## 2018-04-03 DIAGNOSIS — F329 Major depressive disorder, single episode, unspecified: Secondary | ICD-10-CM | POA: Diagnosis present

## 2018-04-03 DIAGNOSIS — R0602 Shortness of breath: Secondary | ICD-10-CM | POA: Diagnosis not present

## 2018-04-03 DIAGNOSIS — J984 Other disorders of lung: Secondary | ICD-10-CM | POA: Diagnosis not present

## 2018-04-03 DIAGNOSIS — D61818 Other pancytopenia: Secondary | ICD-10-CM | POA: Diagnosis not present

## 2018-04-03 DIAGNOSIS — E669 Obesity, unspecified: Secondary | ICD-10-CM | POA: Diagnosis present

## 2018-04-03 DIAGNOSIS — E1140 Type 2 diabetes mellitus with diabetic neuropathy, unspecified: Secondary | ICD-10-CM | POA: Diagnosis not present

## 2018-04-03 DIAGNOSIS — I517 Cardiomegaly: Secondary | ICD-10-CM | POA: Diagnosis not present

## 2018-04-03 DIAGNOSIS — R531 Weakness: Secondary | ICD-10-CM | POA: Diagnosis present

## 2018-04-03 DIAGNOSIS — Z794 Long term (current) use of insulin: Secondary | ICD-10-CM | POA: Diagnosis not present

## 2018-04-03 DIAGNOSIS — J9621 Acute and chronic respiratory failure with hypoxia: Secondary | ICD-10-CM | POA: Diagnosis present

## 2018-04-03 DIAGNOSIS — G4733 Obstructive sleep apnea (adult) (pediatric): Secondary | ICD-10-CM | POA: Diagnosis present

## 2018-04-03 DIAGNOSIS — E039 Hypothyroidism, unspecified: Secondary | ICD-10-CM | POA: Diagnosis present

## 2018-04-03 DIAGNOSIS — D696 Thrombocytopenia, unspecified: Secondary | ICD-10-CM | POA: Diagnosis not present

## 2018-04-03 DIAGNOSIS — I2510 Atherosclerotic heart disease of native coronary artery without angina pectoris: Secondary | ICD-10-CM | POA: Diagnosis present

## 2018-04-03 DIAGNOSIS — R233 Spontaneous ecchymoses: Secondary | ICD-10-CM | POA: Diagnosis not present

## 2018-04-03 DIAGNOSIS — G4730 Sleep apnea, unspecified: Secondary | ICD-10-CM | POA: Diagnosis present

## 2018-04-03 DIAGNOSIS — I5043 Acute on chronic combined systolic (congestive) and diastolic (congestive) heart failure: Secondary | ICD-10-CM | POA: Diagnosis not present

## 2018-04-03 DIAGNOSIS — J9622 Acute and chronic respiratory failure with hypercapnia: Secondary | ICD-10-CM | POA: Diagnosis present

## 2018-04-03 DIAGNOSIS — K219 Gastro-esophageal reflux disease without esophagitis: Secondary | ICD-10-CM | POA: Diagnosis present

## 2018-04-03 DIAGNOSIS — R0902 Hypoxemia: Secondary | ICD-10-CM | POA: Diagnosis not present

## 2018-04-03 DIAGNOSIS — E785 Hyperlipidemia, unspecified: Secondary | ICD-10-CM | POA: Diagnosis present

## 2018-04-03 DIAGNOSIS — Z856 Personal history of leukemia: Secondary | ICD-10-CM | POA: Diagnosis not present

## 2018-04-03 DIAGNOSIS — J9690 Respiratory failure, unspecified, unspecified whether with hypoxia or hypercapnia: Secondary | ICD-10-CM | POA: Diagnosis not present

## 2018-04-03 IMAGING — MR MR LUMBAR SPINE W/O CM
4 of 5 series · 27 of 48 positions shown · non-contrast
Comparison: None.

CLINICAL DATA: Low back pain and bilateral leg pain

EXAM:
MRI LUMBAR SPINE WITHOUT CONTRAST
TECHNIQUE: Multiplanar, multisequence MR imaging of the lumbar spine was
performed. No intravenous contrast was administered.

[Series 3: T2 · sagittal · 4.0mm · 0.81mm/px · 6 of 17 slices shown (1 of 2)]
[im 1/17]
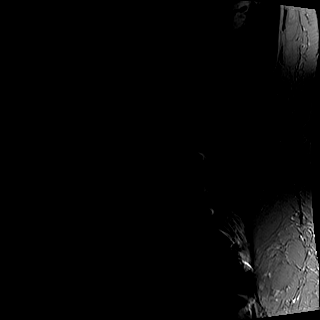
[im 4/17]
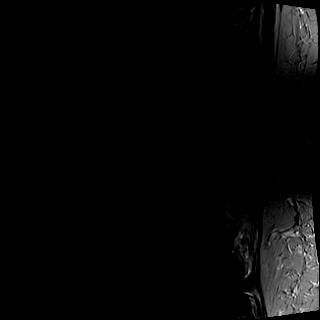
[im 7/17]
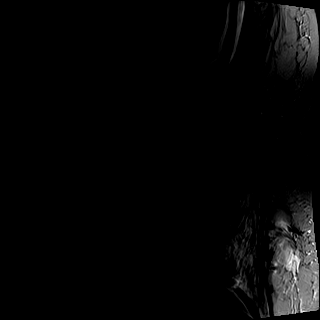
[im 10/17]
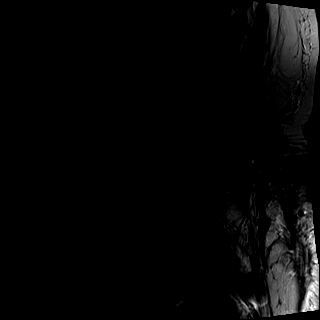
[im 13/17]
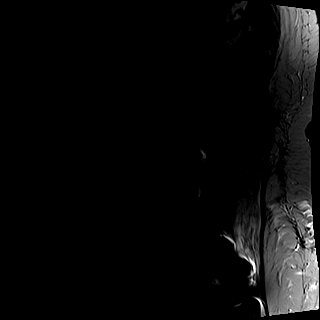
[im 17/17]
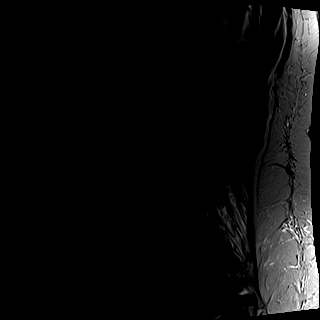

[Series 4: T1 · sagittal · 4.0mm · 0.41mm/px · 5 of 17 slices shown (1 of 2)]
[im 1/17]
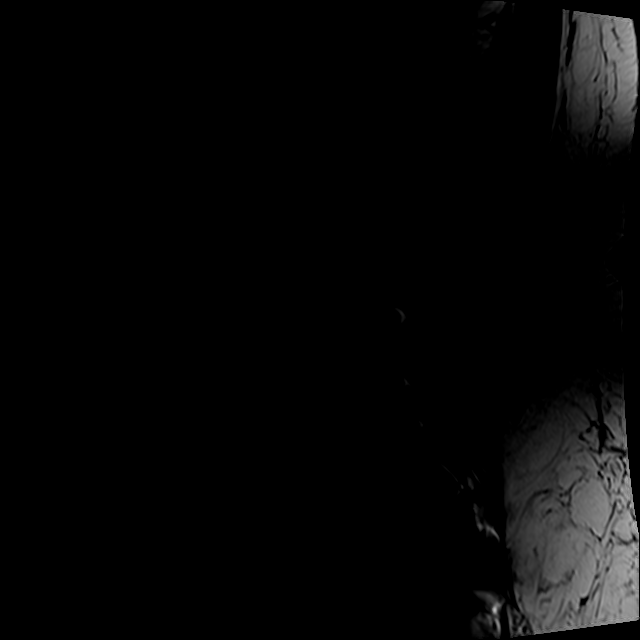
[im 5/17]
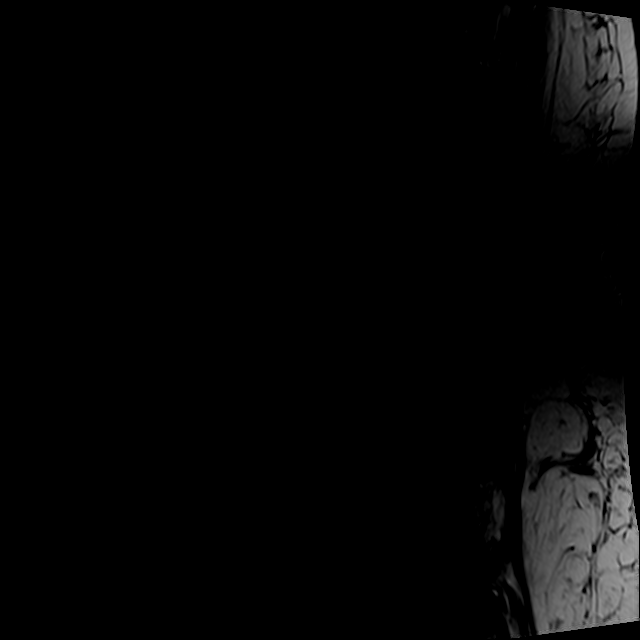
[im 9/17]
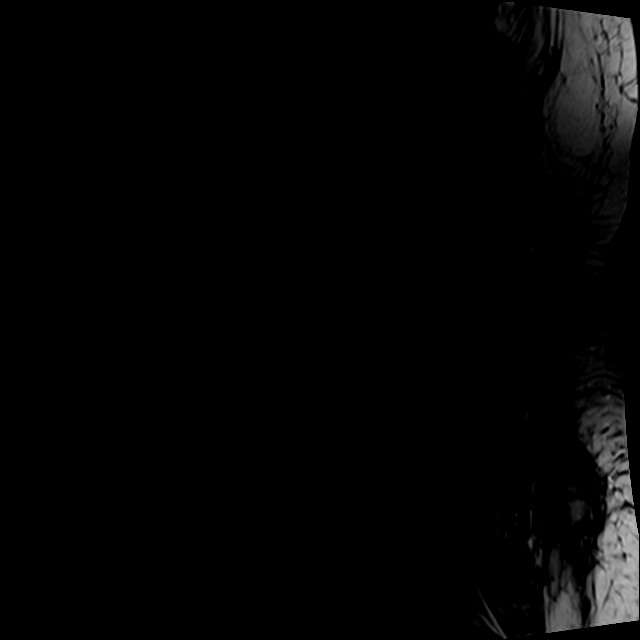
[im 13/17]
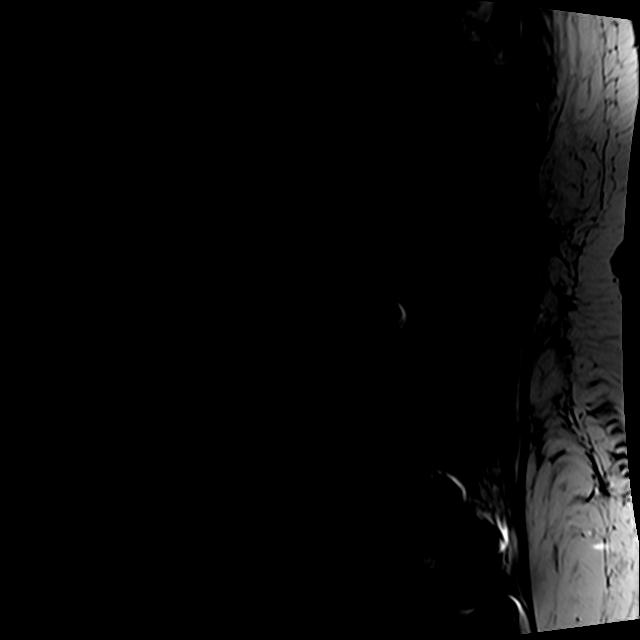
[im 17/17]
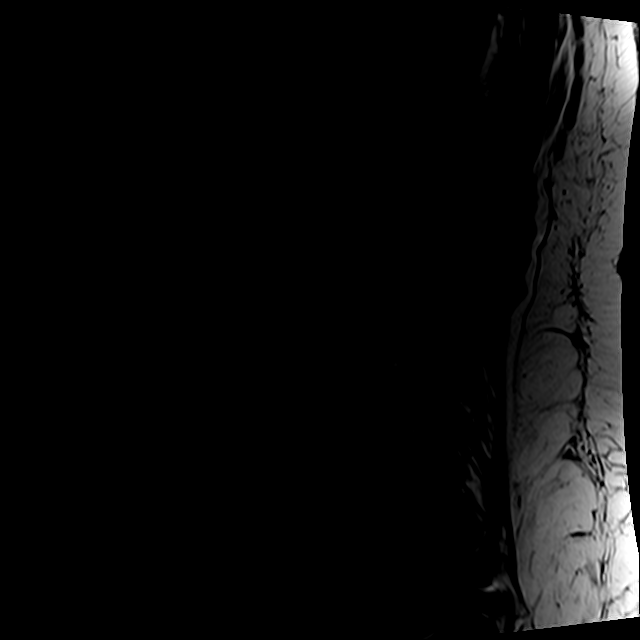

[Series 7: T1 · axial · 4.0mm · 0.39mm/px · z∈[-60,+186]mm · 6 of 51 slices shown (2 of 2)]
[im 4/51]
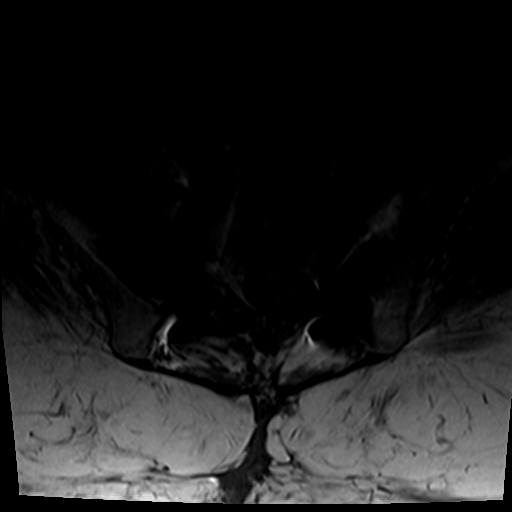
[im 7/51]
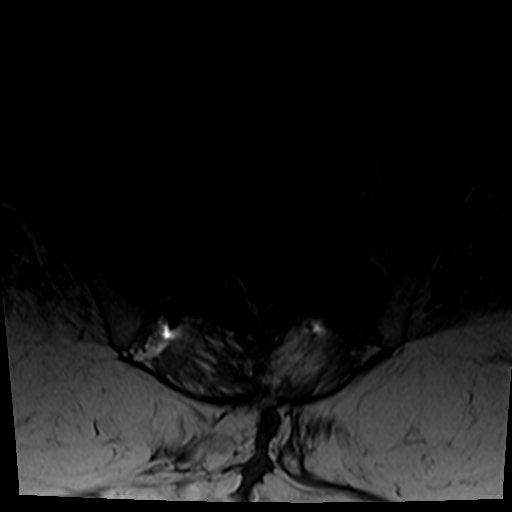
[im 11/51]
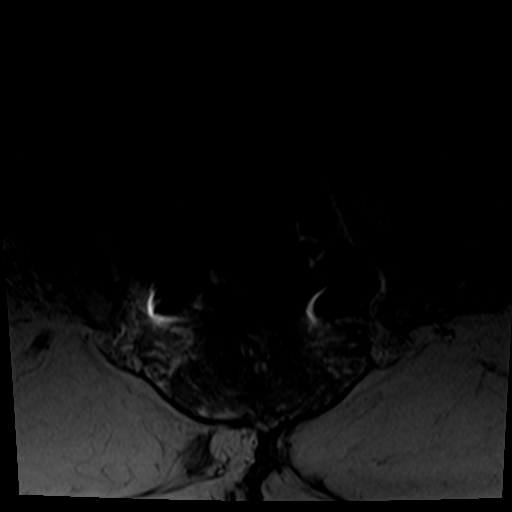
[im 17/51]
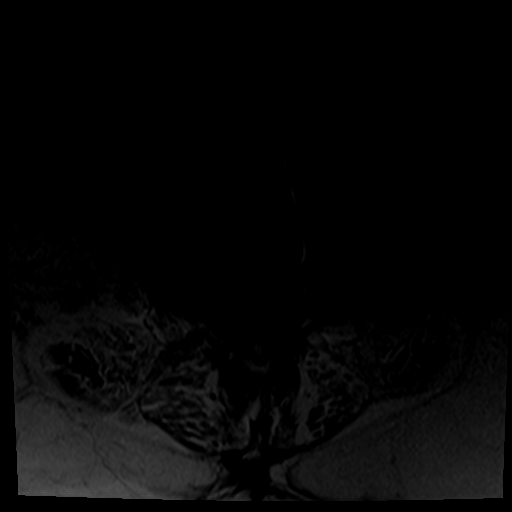
[im 27/51]
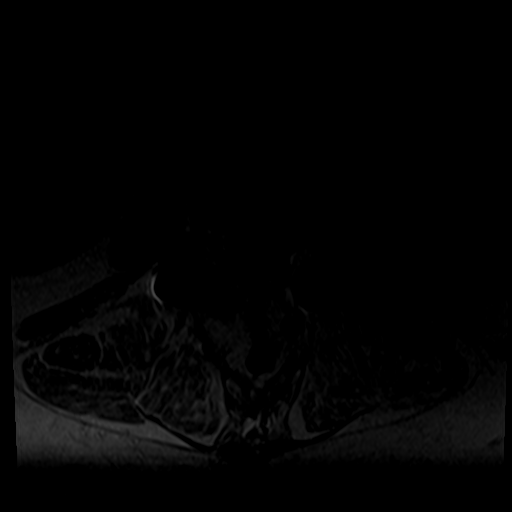
[im 44/51]
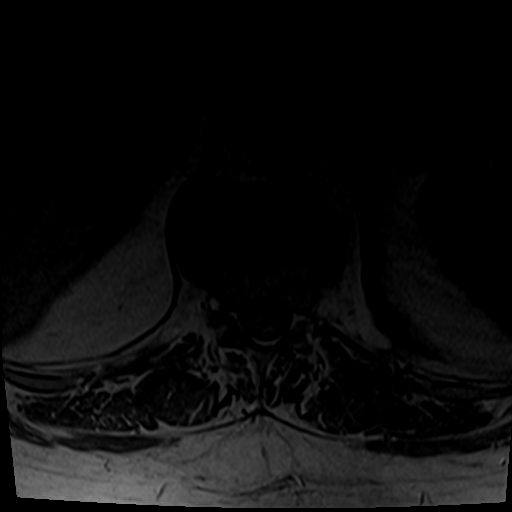

[Series 100: T2 · axial · 4.0mm · 0.78mm/px · z∈[-60,+220]mm · 10 of 51 slices shown (2 of 2)]
[im 4/51]
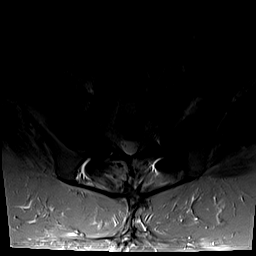
[im 7/51]
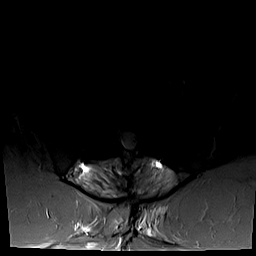
[im 11/51]
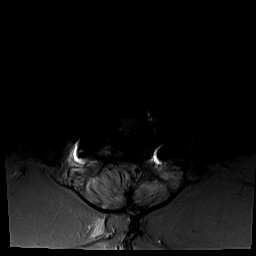
[im 17/51]
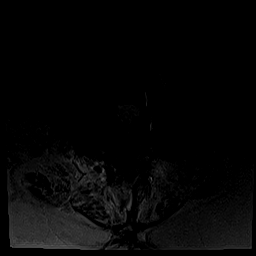
[im 24/51]
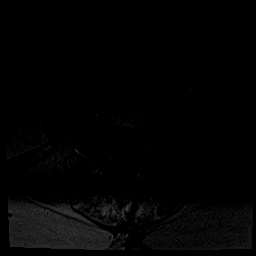
[im 27/51]
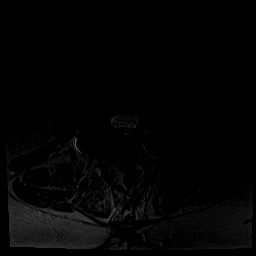
[im 31/51]
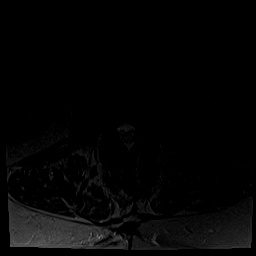
[im 37/51]
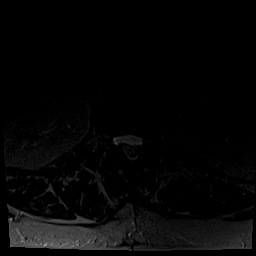
[im 44/51]
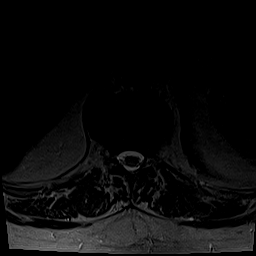
[im 51/51]
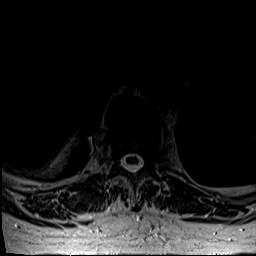

[27 of 48 positions shown; findings below may reference images not displayed]

FINDINGS: Segmentation:  Standard.

Alignment: Grade 1 retrolisthesis at L1-L2 and L2-L3. Grade 1
anterolisthesis at L4-L5. Chronic compression deformity of T11 with
approximately 25% anterior height loss.

Vertebrae:  Posterior spinal fusion extends from L3-S1.

Conus medullaris: Extends to the T12 level and appears normal.

Paraspinal and other soft tissues: The visualized aorta, IVC and
iliac vessels are normal. The visualized retroperitoneal organs and
paraspinal soft tissues are normal.

Disc levels:

T11-T12:: Minimal disc bulge.  No stenosis.

T12-L1:  Small disc bulge.  No stenosis.

L1-L2: Disc desiccation with mild bulge and moderate bilateral facet
hypertrophy. Moderate right and severe left neural foraminal
stenosis. No spinal canal stenosis.

L2-L3: Disc desiccation with minimal bulge.  No stenosis.

L3-L4: Postfusion changes with wide patency of the thecal sac. No
foraminal stenosis.

L4-L5: Postfusion changes with wide patency of the thecal sac. Small
amount of fluid dorsal to the spinal canal. Visualization neural
foramina limited by susceptibility artifact from spinal hardware.

L5-S1: Postfusion changes. No spinal canal or neural foraminal
stenosis.

Visualized sacrum: Normal.
IMPRESSION: 1. L3-S1 PLIF without spinal canal stenosis at fused levels.
2. Moderate right and severe left neural foraminal stenosis at L1-L2
due to combination of disc disease and facet arthrosis.
3. Poor visualization of the L4-L5 neural foramina due to
susceptibility artifact of spinal hardware. There is some degree of
neural foraminal stenosis at this level, but visualization is too
poor to quantify it accurately.

## 2018-04-06 MED ORDER — BUPROPION HCL ER (XL) 150 MG PO TB24
150.00 | ORAL_TABLET | ORAL | Status: DC
Start: 2018-04-05 — End: 2018-04-06

## 2018-04-06 MED ORDER — DULOXETINE HCL 30 MG PO CPEP
60.00 | ORAL_CAPSULE | ORAL | Status: DC
Start: 2018-04-04 — End: 2018-04-06

## 2018-04-06 MED ORDER — SENNA-DOCUSATE SODIUM 8.6-50 MG PO TABS
ORAL_TABLET | ORAL | Status: DC
Start: 2018-04-04 — End: 2018-04-06

## 2018-04-06 MED ORDER — FAMOTIDINE 20 MG PO TABS
20.00 | ORAL_TABLET | ORAL | Status: DC
Start: 2018-04-05 — End: 2018-04-06

## 2018-04-06 MED ORDER — GENERIC EXTERNAL MEDICATION
Status: DC
Start: ? — End: 2018-04-06

## 2018-04-06 MED ORDER — HALOPERIDOL LACTATE 5 MG/ML IJ SOLN
5.00 | INTRAMUSCULAR | Status: DC
Start: ? — End: 2018-04-06

## 2018-04-06 MED ORDER — CARVEDILOL 6.25 MG PO TABS
12.50 | ORAL_TABLET | ORAL | Status: DC
Start: 2018-04-04 — End: 2018-04-06

## 2018-04-06 MED ORDER — LEVOTHYROXINE SODIUM 112 MCG PO TABS
ORAL_TABLET | ORAL | Status: DC
Start: ? — End: 2018-04-06

## 2018-04-06 MED ORDER — LACTULOSE 10 GM/15ML PO SOLN
20.00 | ORAL | Status: DC
Start: ? — End: 2018-04-06

## 2018-04-06 MED ORDER — FUROSEMIDE 10 MG/ML IJ SOLN
80.00 | INTRAMUSCULAR | Status: DC
Start: 2018-04-05 — End: 2018-04-06

## 2018-04-06 MED ORDER — SODIUM CHLORIDE 0.9 % IV SOLN
INTRAVENOUS | Status: DC
Start: ? — End: 2018-04-06

## 2018-04-06 MED ORDER — ALBUTEROL SULFATE (2.5 MG/3ML) 0.083% IN NEBU
2.50 | INHALATION_SOLUTION | RESPIRATORY_TRACT | Status: DC
Start: ? — End: 2018-04-06

## 2018-04-06 MED ORDER — DIGOXIN 125 MCG PO TABS
ORAL_TABLET | ORAL | Status: DC
Start: ? — End: 2018-04-06

## 2018-04-06 MED ORDER — AMIODARONE HCL 200 MG PO TABS
200.00 | ORAL_TABLET | ORAL | Status: DC
Start: 2018-04-05 — End: 2018-04-06

## 2018-04-18 DEATH — deceased
